# Patient Record
Sex: Female | Born: 1985 | Race: White | Hispanic: No | Marital: Married | Smoking: Never smoker
Health system: Southern US, Community
[De-identification: ages and names within clinical notes are randomized; demographics above are authoritative.]

## PROBLEM LIST (undated history)

## (undated) DIAGNOSIS — R519 Headache, unspecified: Secondary | ICD-10-CM

## (undated) DIAGNOSIS — I498 Other specified cardiac arrhythmias: Secondary | ICD-10-CM

## (undated) DIAGNOSIS — F329 Major depressive disorder, single episode, unspecified: Secondary | ICD-10-CM

## (undated) DIAGNOSIS — Z9889 Other specified postprocedural states: Secondary | ICD-10-CM

## (undated) DIAGNOSIS — F32A Depression, unspecified: Secondary | ICD-10-CM

## (undated) DIAGNOSIS — R51 Headache: Secondary | ICD-10-CM

## (undated) DIAGNOSIS — O09299 Supervision of pregnancy with other poor reproductive or obstetric history, unspecified trimester: Secondary | ICD-10-CM

## (undated) DIAGNOSIS — R112 Nausea with vomiting, unspecified: Secondary | ICD-10-CM

---

## 2010-05-09 ENCOUNTER — Ambulatory Visit: Payer: Self-pay | Admitting: Family Medicine

## 2013-06-25 ENCOUNTER — Ambulatory Visit: Payer: Self-pay | Admitting: Physician Assistant

## 2013-06-25 LAB — URINALYSIS, COMPLETE
Bilirubin,UR: NEGATIVE
GLUCOSE, UR: NEGATIVE mg/dL (ref 0–75)
KETONE: NEGATIVE
Nitrite: POSITIVE
Ph: 6 (ref 4.5–8.0)
SPECIFIC GRAVITY: 1.01 (ref 1.003–1.030)

## 2013-06-25 LAB — PREGNANCY, URINE: PREGNANCY TEST, URINE: NEGATIVE m[IU]/mL

## 2013-06-27 LAB — URINE CULTURE

## 2014-09-25 ENCOUNTER — Ambulatory Visit
Admission: EM | Admit: 2014-09-25 | Discharge: 2014-09-25 | Disposition: A | Discharge status: Home / Self Care | Payer: Self-pay | Attending: Family Medicine | Admitting: Family Medicine

## 2014-09-25 ENCOUNTER — Encounter: Payer: Self-pay | Admitting: Emergency Medicine

## 2014-09-25 DIAGNOSIS — J209 Acute bronchitis, unspecified: Secondary | ICD-10-CM

## 2014-09-25 MED ORDER — HYDROCOD POLST-CPM POLST ER 10-8 MG/5ML PO SUER: 5.0000 mL | Freq: Every evening | ORAL | Status: DC | PRN

## 2014-09-25 MED ORDER — AZITHROMYCIN 250 MG PO TABS: 250.0000 mg | ORAL_TABLET | Freq: Every day | ORAL | Status: DC

## 2014-09-25 NOTE — Discharge Instructions (Signed)

## 2014-09-25 NOTE — ED Provider Notes (Signed)
CSN: [id number]     Arrival date & time 09/25/14  1559 History   First MD Initiated Contact with Patient 09/25/14 1703     Chief Complaint  Patient presents with  . Cough   (Consider location/radiation/quality/duration/timing/severity/associated sxs/prior Treatment) HPI   This patient presents with a one-week history of congestion and cough has been productive of yellow sputum and now is causing her some chest discomfort in the center of her chest. She states that she is coughing all night long and is really getting tired of it. NyQuil does not seem to be helping. She's had some nasal congestion in the beginning but seems now to have settled in her chest. She's not been febrile nor has she had any chills.  History reviewed. No pertinent past medical history. History reviewed. No pertinent past surgical history. History reviewed. No pertinent family history. History  Substance Use Topics  . Smoking status: Never Smoker   . Smokeless tobacco: Never Used  . Alcohol Use: Yes   OB History    No data available     Review of Systems  HENT: Positive for congestion, postnasal drip, rhinorrhea and voice change.   Respiratory: Positive for cough and chest tightness.   Cardiovascular: Positive for chest pain.  Gastrointestinal: Negative for nausea, vomiting, abdominal pain and diarrhea.  All other systems reviewed and are negative.   Allergies  Review of patient's allergies indicates no known allergies.  Home Medications   Prior to Admission medications   Medication Sig Start Date End Date Taking? Authorizing Provider  azithromycin (ZITHROMAX Z-PAK) 250 MG tablet Take 1 tablet (250 mg total) by mouth daily. 09/25/14   Lutricia FeilWilliam P Roemer, PA-C  chlorpheniramine-HYDROcodone (TUSSIONEX PENNKINETIC ER) 10-8 MG/5ML SUER Take 5 mLs by mouth at bedtime as needed for cough. 09/25/14   Chrissie NoaWilliam P Roemer, PA-C   BP 107/51 mmHg  Pulse 81  Temp(Src) 98 F (36.7 C) (Oral)  Resp 16  Ht 5\' 2"  (1.575 m)   Wt 124 lb (56.246 kg)  BMI 22.67 kg/m2  SpO2 100%  LMP 09/18/2014 (Exact Date) Physical Exam  Constitutional: She is oriented to person, place, and time. She appears well-developed and well-nourished.  HENT:  Head: Normocephalic and atraumatic.  Right Ear: External ear normal.  Nose: Nose normal.  Mouth/Throat: Oropharynx is clear and moist.  Eyes: EOM are normal. Pupils are equal, round, and reactive to light. Right eye exhibits no discharge. Left eye exhibits no discharge.  Neck: Normal range of motion. Neck supple.  Cardiovascular: Normal rate, regular rhythm and normal heart sounds.  Exam reveals no gallop and no friction rub.   No murmur heard. Pulmonary/Chest: Effort normal and breath sounds normal. No respiratory distress. She has no wheezes. She has no rales. She exhibits no tenderness.  Musculoskeletal: Normal range of motion.  Lymphadenopathy:    She has no cervical adenopathy.  Neurological: She is alert and oriented to person, place, and time. She has normal reflexes.  Skin: Skin is warm and dry. No rash noted.  Psychiatric: She has a normal mood and affect. Her behavior is normal. Judgment and thought content normal.  Nursing note and vitals reviewed.   ED Course  Procedures (including critical care time) Labs Review Labs Reviewed - No data to display  Imaging Review No results found.   MDM   1. Acute bronchitis, unspecified organism    New Prescriptions   AZITHROMYCIN (ZITHROMAX Z-PAK) 250 MG TABLET    Take 1 tablet (250 mg total) by mouth  daily.   CHLORPHENIRAMINE-HYDROCODONE (TUSSIONEX PENNKINETIC ER) 10-8 MG/5ML SUER    Take 5 mLs by mouth at bedtime as needed for cough.  Plan: 1. Test/x-ray results and diagnosis reviewed with patient 2. rx as per orders; risks, benefits, potential side effects reviewed with patient 3. Recommend supportive treatment with Flonase,z pack and tussionex prn. Increase fluids and rest. 4. F/u prn if symptoms worsen or don't  improve      Lutricia Feil, PA-C 09/25/14 1730

## 2014-09-25 NOTE — ED Notes (Signed)
Patient c/o cough and chest congestion since Friday.  Patient denies fevers.  

## 2016-08-16 ENCOUNTER — Ambulatory Visit: Payer: Self-pay | Admitting: Obstetrics and Gynecology

## 2017-09-24 DIAGNOSIS — O09299 Supervision of pregnancy with other poor reproductive or obstetric history, unspecified trimester: Secondary | ICD-10-CM

## 2017-09-24 HISTORY — PX: DILATION AND CURETTAGE OF UTERUS: SHX78

## 2017-09-24 HISTORY — DX: Supervision of pregnancy with other poor reproductive or obstetric history, unspecified trimester: O09.299

## 2018-07-27 ENCOUNTER — Ambulatory Visit (INDEPENDENT_AMBULATORY_CARE_PROVIDER_SITE_OTHER)
Admit: 2018-07-27 | Discharge: 2018-07-27 | Disposition: A | Discharge status: Home / Self Care | Payer: Self-pay | Attending: Urgent Care | Admitting: Urgent Care

## 2018-07-27 ENCOUNTER — Observation Stay
Admission: EM | Admit: 2018-07-27 | Discharge: 2018-07-28 | Disposition: A | Discharge status: Home / Self Care | Payer: Self-pay | Attending: Surgery | Admitting: Surgery

## 2018-07-27 ENCOUNTER — Other Ambulatory Visit: Payer: Self-pay

## 2018-07-27 ENCOUNTER — Emergency Department: Payer: Self-pay | Admitting: Anesthesiology

## 2018-07-27 ENCOUNTER — Encounter
Admission: EM | Disposition: A | Discharge status: Home / Self Care | Payer: Self-pay | Source: Home / Self Care | Attending: Emergency Medicine

## 2018-07-27 ENCOUNTER — Encounter: Payer: Self-pay | Admitting: Emergency Medicine

## 2018-07-27 ENCOUNTER — Ambulatory Visit
Admission: EM | Admit: 2018-07-27 | Discharge: 2018-07-27 | Disposition: A | Discharge status: Other Acute Inpatient Hospital | Payer: Self-pay | Attending: Urgent Care | Admitting: Urgent Care

## 2018-07-27 DIAGNOSIS — R945 Abnormal results of liver function studies: Secondary | ICD-10-CM

## 2018-07-27 DIAGNOSIS — K625 Hemorrhage of anus and rectum: Secondary | ICD-10-CM | POA: Insufficient documentation

## 2018-07-27 DIAGNOSIS — R52 Pain, unspecified: Secondary | ICD-10-CM

## 2018-07-27 DIAGNOSIS — Z91040 Latex allergy status: Secondary | ICD-10-CM | POA: Insufficient documentation

## 2018-07-27 DIAGNOSIS — K810 Acute cholecystitis: Secondary | ICD-10-CM

## 2018-07-27 DIAGNOSIS — R1011 Right upper quadrant pain: Secondary | ICD-10-CM

## 2018-07-27 DIAGNOSIS — K819 Cholecystitis, unspecified: Secondary | ICD-10-CM

## 2018-07-27 DIAGNOSIS — R7989 Other specified abnormal findings of blood chemistry: Secondary | ICD-10-CM

## 2018-07-27 DIAGNOSIS — K8012 Calculus of gallbladder with acute and chronic cholecystitis without obstruction: Principal | ICD-10-CM | POA: Insufficient documentation

## 2018-07-27 HISTORY — DX: Headache, unspecified: R51.9

## 2018-07-27 HISTORY — DX: Nausea with vomiting, unspecified: R11.2

## 2018-07-27 HISTORY — DX: Other specified cardiac arrhythmias: I49.8

## 2018-07-27 HISTORY — DX: Headache: R51

## 2018-07-27 HISTORY — DX: Depression, unspecified: F32.A

## 2018-07-27 HISTORY — DX: Major depressive disorder, single episode, unspecified: F32.9

## 2018-07-27 HISTORY — DX: Supervision of pregnancy with other poor reproductive or obstetric history, unspecified trimester: O09.299

## 2018-07-27 HISTORY — PX: CHOLECYSTECTOMY: SHX55

## 2018-07-27 HISTORY — DX: Nausea with vomiting, unspecified: Z98.890

## 2018-07-27 LAB — CBC WITH DIFFERENTIAL/PLATELET
Abs Immature Granulocytes: 0.03 10*3/uL (ref 0.00–0.07)
Basophils Absolute: 0 10*3/uL (ref 0.0–0.1)
Basophils Relative: 0 %
Eosinophils Absolute: 0.2 10*3/uL (ref 0.0–0.5)
Eosinophils Relative: 2 %
HCT: 34.3 % — ABNORMAL LOW (ref 36.0–46.0)
Hemoglobin: 11.6 g/dL — ABNORMAL LOW (ref 12.0–15.0)
Immature Granulocytes: 0 %
Lymphocytes Relative: 19 %
Lymphs Abs: 1.8 10*3/uL (ref 0.7–4.0)
MCH: 30.4 pg (ref 26.0–34.0)
MCHC: 33.8 g/dL (ref 30.0–36.0)
MCV: 89.8 fL (ref 80.0–100.0)
Monocytes Absolute: 0.8 10*3/uL (ref 0.1–1.0)
Monocytes Relative: 9 %
Neutro Abs: 6.6 10*3/uL (ref 1.7–7.7)
Neutrophils Relative %: 70 %
Platelets: 278 10*3/uL (ref 150–400)
RBC: 3.82 MIL/uL — ABNORMAL LOW (ref 3.87–5.11)
RDW: 14.1 % (ref 11.5–15.5)
WBC: 9.6 10*3/uL (ref 4.0–10.5)
nRBC: 0 % (ref 0.0–0.2)

## 2018-07-27 LAB — COMPREHENSIVE METABOLIC PANEL
ALT: 436 U/L — ABNORMAL HIGH (ref 0–44)
AST: 159 U/L — ABNORMAL HIGH (ref 15–41)
Albumin: 3.9 g/dL (ref 3.5–5.0)
Alkaline Phosphatase: 125 U/L (ref 38–126)
Anion gap: 6 (ref 5–15)
BUN: 11 mg/dL (ref 6–20)
CO2: 24 mmol/L (ref 22–32)
Calcium: 8.6 mg/dL — ABNORMAL LOW (ref 8.9–10.3)
Chloride: 106 mmol/L (ref 98–111)
Creatinine, Ser: 0.73 mg/dL (ref 0.44–1.00)
GFR calc Af Amer: >60 mL/min (ref >60)
GFR calc non Af Amer: >60 mL/min (ref >60)
Glucose, Bld: 105 mg/dL — ABNORMAL HIGH (ref 70–99)
Potassium: 3.9 mmol/L (ref 3.5–5.1)
Sodium: 136 mmol/L (ref 135–145)
Total Bilirubin: 1.4 mg/dL — ABNORMAL HIGH (ref 0.3–1.2)
Total Protein: 7 g/dL (ref 6.5–8.1)

## 2018-07-27 LAB — LIPASE, BLOOD: Lipase: 30 U/L (ref 11–51)

## 2018-07-27 LAB — POCT PREGNANCY, URINE: Preg Test, Ur: NEGATIVE

## 2018-07-27 SURGERY — LAPAROSCOPIC CHOLECYSTECTOMY
Anesthesia: General | Site: Abdomen

## 2018-07-27 MED ORDER — ALUM & MAG HYDROXIDE-SIMETH 200-200-20 MG/5ML PO SUSP
30.0000 mL | Freq: Once | ORAL | Status: AC
  Administered 2018-07-27: 09:00:00 30 mL via ORAL

## 2018-07-27 MED ORDER — PROMETHAZINE HCL 25 MG/ML IJ SOLN
6.2500 mg | INTRAMUSCULAR | Status: DC | PRN
  Administered 2018-07-27: 12.5 mg via INTRAVENOUS

## 2018-07-27 MED ORDER — ONDANSETRON HCL 4 MG/2ML IJ SOLN: 4.0000 mg | Freq: Four times a day (QID) | INTRAMUSCULAR | Status: DC | PRN

## 2018-07-27 MED ORDER — DEXAMETHASONE SODIUM PHOSPHATE 10 MG/ML IJ SOLN
INTRAMUSCULAR | Status: DC | PRN
  Administered 2018-07-27: 4 mg via INTRAVENOUS

## 2018-07-27 MED ORDER — KCL IN DEXTROSE-NACL 20-5-0.45 MEQ/L-%-% IV SOLN
INTRAVENOUS | Status: DC
  Administered 2018-07-27 – 2018-07-28 (×2): via INTRAVENOUS
  Filled 2018-07-27 (×5): qty 1000

## 2018-07-27 MED ORDER — SUGAMMADEX SODIUM 200 MG/2ML IV SOLN
INTRAVENOUS | Status: AC
  Filled 2018-07-27: qty 2

## 2018-07-27 MED ORDER — LACTATED RINGERS IV SOLN
INTRAVENOUS | Status: DC
  Administered 2018-07-27: 100 mL/h via INTRAVENOUS

## 2018-07-27 MED ORDER — SODIUM CHLORIDE 0.9 % IV BOLUS
1000.0000 mL | Freq: Once | INTRAVENOUS | Status: AC
  Administered 2018-07-27: 1000 mL via INTRAVENOUS

## 2018-07-27 MED ORDER — GLYCOPYRROLATE 0.2 MG/ML IJ SOLN
INTRAMUSCULAR | Status: DC | PRN
  Administered 2018-07-27: 0.2 mg via INTRAVENOUS

## 2018-07-27 MED ORDER — FENTANYL CITRATE (PF) 100 MCG/2ML IJ SOLN
50.0000 ug | Freq: Once | INTRAMUSCULAR | Status: AC
  Administered 2018-07-27: 50 ug via INTRAVENOUS
  Filled 2018-07-27: qty 2

## 2018-07-27 MED ORDER — FENTANYL CITRATE (PF) 100 MCG/2ML IJ SOLN
INTRAMUSCULAR | Status: AC
  Filled 2018-07-27: qty 2

## 2018-07-27 MED ORDER — ONDANSETRON HCL 4 MG/2ML IJ SOLN
4.0000 mg | Freq: Once | INTRAMUSCULAR | Status: AC
  Administered 2018-07-27: 4 mg via INTRAVENOUS
  Filled 2018-07-27: qty 2

## 2018-07-27 MED ORDER — SODIUM CHLORIDE FLUSH 0.9 % IV SOLN
INTRAVENOUS | Status: AC
  Filled 2018-07-27: qty 10

## 2018-07-27 MED ORDER — GLYCOPYRROLATE 0.2 MG/ML IJ SOLN
INTRAMUSCULAR | Status: AC
  Filled 2018-07-27: qty 1

## 2018-07-27 MED ORDER — FENTANYL CITRATE (PF) 100 MCG/2ML IJ SOLN
25.0000 ug | INTRAMUSCULAR | Status: DC | PRN
  Administered 2018-07-27 (×2): 50 ug via INTRAVENOUS

## 2018-07-27 MED ORDER — LIDOCAINE HCL (CARDIAC) PF 100 MG/5ML IV SOSY
PREFILLED_SYRINGE | INTRAVENOUS | Status: DC | PRN
  Administered 2018-07-27: 50 mg via INTRAVENOUS

## 2018-07-27 MED ORDER — CHLORHEXIDINE GLUCONATE CLOTH 2 % EX PADS: 6.0000 | MEDICATED_PAD | Freq: Once | CUTANEOUS | Status: DC

## 2018-07-27 MED ORDER — ONDANSETRON HCL 4 MG/2ML IJ SOLN
INTRAMUSCULAR | Status: AC
  Administered 2018-07-27: 8 mg
  Filled 2018-07-27: qty 4

## 2018-07-27 MED ORDER — SUCCINYLCHOLINE CHLORIDE 20 MG/ML IJ SOLN
INTRAMUSCULAR | Status: DC | PRN
  Administered 2018-07-27: 100 mg via INTRAVENOUS

## 2018-07-27 MED ORDER — FENTANYL CITRATE (PF) 100 MCG/2ML IJ SOLN
INTRAMUSCULAR | Status: DC | PRN
  Administered 2018-07-27: 100 ug via INTRAVENOUS
  Administered 2018-07-27 (×2): 50 ug via INTRAVENOUS

## 2018-07-27 MED ORDER — ACETAMINOPHEN 500 MG PO TABS
1000.0000 mg | ORAL_TABLET | ORAL | Status: AC
  Administered 2018-07-27: 14:00:00 1000 mg via ORAL

## 2018-07-27 MED ORDER — LIDOCAINE HCL (PF) 2 % IJ SOLN
INTRAMUSCULAR | Status: AC
  Filled 2018-07-27: qty 10

## 2018-07-27 MED ORDER — LIDOCAINE VISCOUS HCL 2 % MT SOLN
15.0000 mL | Freq: Once | OROMUCOSAL | Status: AC
  Administered 2018-07-27: 09:00:00 15 mL via ORAL

## 2018-07-27 MED ORDER — DEXAMETHASONE SODIUM PHOSPHATE 4 MG/ML IJ SOLN
INTRAMUSCULAR | Status: AC
  Filled 2018-07-27: qty 1

## 2018-07-27 MED ORDER — ONDANSETRON HCL 4 MG/2ML IJ SOLN
INTRAMUSCULAR | Status: DC | PRN
  Administered 2018-07-27: 4 mg via INTRAVENOUS

## 2018-07-27 MED ORDER — PROPOFOL 10 MG/ML IV BOLUS
INTRAVENOUS | Status: DC | PRN
  Administered 2018-07-27: 150 mg via INTRAVENOUS

## 2018-07-27 MED ORDER — MORPHINE SULFATE (PF) 2 MG/ML IV SOLN
2.0000 mg | INTRAVENOUS | Status: DC | PRN
  Administered 2018-07-27: 2 mg via INTRAVENOUS
  Filled 2018-07-27: qty 1

## 2018-07-27 MED ORDER — PROPOFOL 10 MG/ML IV BOLUS
INTRAVENOUS | Status: AC
  Filled 2018-07-27: qty 20

## 2018-07-27 MED ORDER — CHLORHEXIDINE GLUCONATE CLOTH 2 % EX PADS
6.0000 | MEDICATED_PAD | Freq: Once | CUTANEOUS | Status: AC
  Administered 2018-07-27: 6 via TOPICAL

## 2018-07-27 MED ORDER — ACETAMINOPHEN 500 MG PO TABS
1000.0000 mg | ORAL_TABLET | Freq: Four times a day (QID) | ORAL | Status: DC
  Administered 2018-07-27 – 2018-07-28 (×2): 1000 mg via ORAL
  Filled 2018-07-27 (×2): qty 2

## 2018-07-27 MED ORDER — PROMETHAZINE HCL 25 MG/ML IJ SOLN
INTRAMUSCULAR | Status: AC
  Filled 2018-07-27: qty 1

## 2018-07-27 MED ORDER — PIPERACILLIN-TAZOBACTAM 3.375 G IVPB
INTRAVENOUS | Status: AC
  Filled 2018-07-27: qty 50

## 2018-07-27 MED ORDER — MIDAZOLAM HCL 2 MG/2ML IJ SOLN
INTRAMUSCULAR | Status: DC | PRN
  Administered 2018-07-27: 2 mg via INTRAVENOUS

## 2018-07-27 MED ORDER — ROCURONIUM BROMIDE 50 MG/5ML IV SOLN
INTRAVENOUS | Status: AC
  Filled 2018-07-27: qty 1

## 2018-07-27 MED ORDER — SODIUM CHLORIDE FLUSH 0.9 % IV SOLN
INTRAVENOUS | Status: AC
  Administered 2018-07-27: 14:00:00
  Filled 2018-07-27: qty 10

## 2018-07-27 MED ORDER — ACETAMINOPHEN 500 MG PO TABS
ORAL_TABLET | ORAL | Status: AC
  Administered 2018-07-27: 1000 mg via ORAL
  Filled 2018-07-27: qty 2

## 2018-07-27 MED ORDER — OXYCODONE HCL 5 MG PO TABS
5.0000 mg | ORAL_TABLET | ORAL | Status: DC | PRN
  Administered 2018-07-27 – 2018-07-28 (×3): 5 mg via ORAL
  Filled 2018-07-27 (×3): qty 1

## 2018-07-27 MED ORDER — PIPERACILLIN-TAZOBACTAM 3.375 G IVPB 30 MIN
3.3750 g | Freq: Once | INTRAVENOUS | Status: AC
  Administered 2018-07-27: 3.375 g via INTRAVENOUS
  Filled 2018-07-27: qty 50

## 2018-07-27 MED ORDER — SODIUM CHLORIDE 0.9 % IV SOLN
8.0000 mg | Freq: Once | INTRAVENOUS | Status: DC
  Filled 2018-07-27: qty 4

## 2018-07-27 MED ORDER — ROCURONIUM BROMIDE 100 MG/10ML IV SOLN
INTRAVENOUS | Status: DC | PRN
  Administered 2018-07-27: 5 mg via INTRAVENOUS
  Administered 2018-07-27: 35 mg via INTRAVENOUS

## 2018-07-27 MED ORDER — SODIUM CHLORIDE 0.9 % IV SOLN
2.0000 g | INTRAVENOUS | Status: DC
  Administered 2018-07-27: 2 g via INTRAVENOUS
  Filled 2018-07-27 (×2): qty 20

## 2018-07-27 MED ORDER — SUGAMMADEX SODIUM 200 MG/2ML IV SOLN
INTRAVENOUS | Status: DC | PRN
  Administered 2018-07-27: 150 mg via INTRAVENOUS

## 2018-07-27 MED ORDER — LIDOCAINE HCL 1 % IJ SOLN
INTRAMUSCULAR | Status: DC | PRN
  Administered 2018-07-27: 20 mL via INTRAMUSCULAR

## 2018-07-27 MED ORDER — ONDANSETRON HCL 4 MG/2ML IJ SOLN
INTRAMUSCULAR | Status: AC
  Filled 2018-07-27: qty 2

## 2018-07-27 MED ORDER — ENOXAPARIN SODIUM 40 MG/0.4ML ~~LOC~~ SOLN: 40.0000 mg | SUBCUTANEOUS | Status: DC

## 2018-07-27 MED ORDER — MIDAZOLAM HCL 2 MG/2ML IJ SOLN
INTRAMUSCULAR | Status: AC
  Filled 2018-07-27: qty 2

## 2018-07-27 MED ORDER — ONDANSETRON 4 MG PO TBDP: 4.0000 mg | ORAL_TABLET | Freq: Four times a day (QID) | ORAL | Status: DC | PRN

## 2018-07-27 SURGICAL SUPPLY — 36 items
APPLIER CLIP ROT 10 11.4 M/L (STAPLE) ×3
CHLORAPREP W/TINT 26 (MISCELLANEOUS) ×3 IMPLANT
CLIP APPLIE ROT 10 11.4 M/L (STAPLE) ×1 IMPLANT
COVER WAND RF STERILE (DRAPES) ×3 IMPLANT
DECANTER SPIKE VIAL GLASS SM (MISCELLANEOUS) ×6 IMPLANT
DERMABOND ADVANCED (GAUZE/BANDAGES/DRESSINGS) ×2
DERMABOND ADVANCED .7 DNX12 (GAUZE/BANDAGES/DRESSINGS) ×1 IMPLANT
DRESSING SURGICEL FIBRLLR 1X2 (HEMOSTASIS) IMPLANT
DRSG SURGICEL FIBRILLAR 1X2 (HEMOSTASIS)
ELECT REM PT RETURN 9FT ADLT (ELECTROSURGICAL) ×3
ELECTRODE REM PT RTRN 9FT ADLT (ELECTROSURGICAL) ×1 IMPLANT
GLOVE BIO SURGEON STRL SZ7 (GLOVE) ×3 IMPLANT
GLOVE BIOGEL PI IND STRL 7.5 (GLOVE) ×1 IMPLANT
GLOVE BIOGEL PI INDICATOR 7.5 (GLOVE) ×2
GOWN STRL REUS W/ TWL LRG LVL3 (GOWN DISPOSABLE) ×3 IMPLANT
GOWN STRL REUS W/TWL LRG LVL3 (GOWN DISPOSABLE) ×6
GRASPER SUT TROCAR 14GX15 (MISCELLANEOUS) ×3 IMPLANT
IRRIGATION STRYKERFLOW (MISCELLANEOUS) IMPLANT
IRRIGATOR STRYKERFLOW (MISCELLANEOUS)
IV NS 1000ML (IV SOLUTION)
IV NS 1000ML BAXH (IV SOLUTION) IMPLANT
KIT TURNOVER KIT A (KITS) ×3 IMPLANT
LABEL OR SOLS (LABEL) ×3 IMPLANT
NEEDLE HYPO 22GX1.5 SAFETY (NEEDLE) ×3 IMPLANT
NEEDLE INSUFFLATION 14GA 120MM (NEEDLE) ×3 IMPLANT
NS IRRIG 1000ML POUR BTL (IV SOLUTION) ×3 IMPLANT
PACK LAP CHOLECYSTECTOMY (MISCELLANEOUS) ×3 IMPLANT
POUCH SPECIMEN RETRIEVAL 10MM (ENDOMECHANICALS) ×3 IMPLANT
SCISSORS METZENBAUM CVD 33 (INSTRUMENTS) ×3 IMPLANT
SET TUBE SMOKE EVAC HIGH FLOW (TUBING) ×3 IMPLANT
SLEEVE ENDOPATH XCEL 5M (ENDOMECHANICALS) ×6 IMPLANT
SUT MNCRL AB 4-0 PS2 18 (SUTURE) ×3 IMPLANT
SUT VICRYL 0 UR6 27IN ABS (SUTURE) ×3 IMPLANT
SUT VICRYL AB 3-0 FS1 BRD 27IN (SUTURE) ×3 IMPLANT
TROCAR XCEL 12X100 BLDLESS (ENDOMECHANICALS) ×3 IMPLANT
TROCAR XCEL NON-BLD 5MMX100MML (ENDOMECHANICALS) ×3 IMPLANT

## 2018-07-27 NOTE — H&P (Signed)
SURGICAL ADMISSION HISTORY & PHYSICAL  HISTORY OF PRESENT ILLNESS (HPI):  33 y.o. female presented to [facility] ED today for evaluation of abdominal pain. Patient reports she first developed epigastric > RUQ abdominal pain 2 days ago (specifically, Tuesday evening 3/31) after eating chicken noodle soup made from scratch. Patient says she has not eaten chicken/meat x 15 years until the past month after she experienced loss of fetal twins and decided to disregard her prior diet. Instead of improving, her pain has instead worsened, becoming severe over the RUQ. She otherwise is not aware of prior RUQ abdominal pain and denies routine/frequent GERD. She denies any fever/chills, cough, CP, or SOB, N/V, or diarrhea. Of note, patient also mentions blood with BM's exclusively during menstruation that ceased during her recent pregnancy and resumed following her miscarriage. She was scheduled to undergo colonoscopy prior to her recent pregnancy, but that was cancelled when she became pregnant and has not since been rescheduled. She also mentions D&C was not performed until 4 days after fetal demise, resulting in sepsis, which resolved following D&C.  Surgery is consulted by ED physician Dr. Don Perking in this context for evaluation and management of acute cholecystitis.  PAST MEDICAL HISTORY (PMH):  History reviewed. No pertinent past medical history.   PAST SURGICAL HISTORY (PSH):       Past Surgical History:  Procedure Laterality Date  . DILATION AND CURETTAGE OF UTERUS      MEDICATIONS:  Prior to Admission medications   Not on File    ALLERGIES:       Allergies  Allergen Reactions  . Latex Other (See Comments)    Burning, redness, edema.    SOCIAL HISTORY: +marijuana (occasional) Social History        Socioeconomic History  . Marital status: Married    Spouse name: Not on file  . Number of children: Not on file  . Years of education: Not on file  . Highest education level: Not on  file  Occupational History  . Not on file  Social Needs  . Financial resource strain: Not on file  . Food insecurity:    Worry: Not on file    Inability: Not on file  . Transportation needs:    Medical: Not on file    Non-medical: Not on file  Tobacco Use  . Smoking status: Never Smoker  . Smokeless tobacco: Never Used  Substance and Sexual Activity  . Alcohol use: Yes    Comment: occasionally  . Drug use: No  . Sexual activity: Not on file  Lifestyle  . Physical activity:    Days per week: Not on file    Minutes per session: Not on file  . Stress: Not on file  Relationships  . Social connections:    Talks on phone: Not on file    Gets together: Not on file    Attends religious service: Not on file    Active member of club or organization: Not on file    Attends meetings of clubs or organizations: Not on file    Relationship status: Not on file  . Intimate partner violence:    Fear of current or ex partner: Not on file    Emotionally abused: Not on file    Physically abused: Not on file    Forced sexual activity: Not on file  Other Topics Concern  . Not on file  Social History Narrative  . Not on file    The patient currently resides (home / rehab  facility / nursing home): Home The patient normally is (ambulatory / bedbound): Ambulatory   FAMILY HISTORY:  No family history on file.   REVIEW OF SYSTEMS:  Constitutional: denies weight loss, fever, chills, or sweats  Eyes: denies any other vision changes, history of eye injury  ENT: denies sore throat, hearing problems  Respiratory: denies shortness of breath, wheezing  Cardiovascular: denies chest pain, palpitations  Gastrointestinal: abdominal pain, N/V, and bowel function as per HPI Genitourinary: denies burning with urination or urinary frequency Musculoskeletal: denies any other joint pains or cramps  Skin: denies any other rashes or skin discolorations  Neurological:  denies any other headache, dizziness, weakness  Psychiatric: denies any other depression, anxiety   All other review of systems were negative   VITAL SIGNS:  Temp:  [98.5 F (36.9 C)] 98.5 F (36.9 C) (04/02 0834) Pulse Rate:  [40-61] 61 (04/02 1230) Resp:  [16-20] 20 (04/02 1230) BP: (126-132)/(66-91) 132/86 (04/02 1230) SpO2:  [99 %-100 %] 99 % (04/02 1230) Weight:  [59 kg] 59 kg (04/02 1130)     Height: 5\' 2"  (157.5 cm) Weight: 59 kg BMI (Calculated): 23.77   INTAKE/OUTPUT:  This shift: No intake/output data recorded.  Last 2 shifts: @IOLAST2SHIFTS @   PHYSICAL EXAM:  Constitutional:  -- Normal body habitus  -- Awake, alert, and oriented x3, no apparent distress Eyes:  -- Pupils equally round and reactive to light  -- No scleral icterus, B/L no occular discharge Ear, nose, throat: -- Neck is FROM WNL -- No jugular venous distension  Pulmonary:  -- No wheezes or rhales -- Equal breath sounds bilaterally -- Breathing non-labored at rest Cardiovascular:  -- S1, S2 present  -- No pericardial rubs  Gastrointestinal:  -- Abdomen soft and non-distended with severe epigastric and RUQ abdominal tenderness to palpation, particularly RUQ with deep inspiration (+Murphy's sign), no guarding or rebound tenderness -- No abdominal masses appreciated, pulsatile or otherwise  Musculoskeletal and Integumentary:  -- Wounds or skin discoloration: None appreciated -- Extremities: B/L UE and LE FROM, hands and feet warm, no edema  Neurologic:  -- Motor function: Intact and symmetric -- Sensation: Intact and symmetric Psychiatric:  -- Mood and affect WNL  Labs:  CBC Latest Ref Rng & Units 07/27/2018  WBC 4.0 - 10.5 K/uL 9.6  Hemoglobin 12.0 - 15.0 g/dL 11.6(L)  Hematocrit 36.0 - 46.0 % 34.3(L)  Platelets 150 - 400 K/uL 278   CMP Latest Ref Rng & Units 07/27/2018  Glucose 70 - 99 mg/dL 580(D)  BUN 6 - 20 mg/dL 11  Creatinine 9.83 - 3.82 mg/dL 5.05  Sodium 397 - 673 mmol/L 136   Potassium 3.5 - 5.1 mmol/L 3.9  Chloride 98 - 111 mmol/L 106  CO2 22 - 32 mmol/L 24  Calcium 8.9 - 10.3 mg/dL 4.1(P)  Total Protein 6.5 - 8.1 g/dL 7.0  Total Bilirubin 0.3 - 1.2 mg/dL 3.7(T)  Alkaline Phos 38 - 126 U/L 125  AST 15 - 41 U/L 159(H)  ALT 0 - 44 U/L 436(H)   Total Bilirubin previously WNL at [facility] (09/2017)  Imaging studies:  Limited RUQ Abdominal Ultrasound (07/27/2018) - available images personally reviewed and discussed with patient and with interpreting radiologist, Dr. Kearney Hard, who reports gallbladder wall thickening ~8 mm and trace pericholecystic fluid, but non-visualization of biliary sludge, though suggests possible non-visualized cystic duct gallstone Gallbladder wall is thickened measuring up to 6 mm. No visible stones. The patient was tender over the gallbladder during the study. Common bile duct diameter:  Normal caliber, 3 mm. Trace perihepatic ascites.  Assessment/Plan: (ICD-10's: K80.01) 33 y.o. female with acute cholecystitis of unclear etiology, whether acalculous (unusual for young healthy patient) or non-visualized cholelithiasis/biliary sludge, complicated by pertinent comorbidities including semi-recent fetal loss and blood per rectum exclusively with menstruation.              - NPO, IV fluid              - agree with pre-op antibiotics (Zosyn administered by ED)             - discussed consideration for confirmative HIDA or MRCP, but patient expresses preference to proceed with cholecystectomy             - all risks, benefits, and alternatives to cholecystectomy +/- IOC were discussed with the patient, all of her questions were answered to her expressed satisfaction, patient expresses she wishes to proceed, and informed consent was obtained.              - upon discussion with and approval from Dr. Everlene Farrier, will proceed with laparoscopic cholecystectomy +/- IOC             - outpatient primary care and GI follow-up for menstrual rectal bleeding              - will repeat LFT's tomorrow morning             - DVT prophylaxis  All of the above findings and recommendations were discussed with the patient, and all of patient's questions were answered to her expressed satisfaction.  -- Scherrie Gerlach Earlene Plater, MD, RPVI [place]: [facility] General Surgery - Partnering for exceptional care. Office: [phone]

## 2018-07-27 NOTE — Anesthesia Procedure Notes (Signed)
Procedure Name: Intubation Performed by: Terina Mcelhinny, CRNA Pre-anesthesia Checklist: Patient identified, Patient being monitored, Timeout performed, Emergency Drugs available and Suction available Patient Re-evaluated:Patient Re-evaluated prior to induction Oxygen Delivery Method: Circle system utilized Preoxygenation: Pre-oxygenation with 100% oxygen Induction Type: IV induction Ventilation: Mask ventilation without difficulty Laryngoscope Size: 3 and McGraph Grade View: Grade I Tube type: Oral Tube size: 7.0 mm Number of attempts: 1 Airway Equipment and Method: Stylet and Video-laryngoscopy Placement Confirmation: ETT inserted through vocal cords under direct vision,  positive ETCO2 and breath sounds checked- equal and bilateral Secured at: 21 cm Tube secured with: Tape Dental Injury: Teeth and Oropharynx as per pre-operative assessment        

## 2018-07-27 NOTE — Discharge Instructions (Addendum)
Your liver function tests are elevated. Your ultrasound suggests that you have an inflamed gallbladder.   I have spoken with Dr. Satira Mccallum, who is an excellent surgeon. He would like to see you NOW in the ER. His plans are for you to have surgery today.

## 2018-07-27 NOTE — ED Notes (Signed)
Report given to OR.

## 2018-07-27 NOTE — Consult Note (Signed)
SURGICAL CONSULTATION NOTE (initial) - cpt: 99244  HISTORY OF PRESENT ILLNESS (HPI):  33 y.o. female presented to [facility] ED today for evaluation of abdominal pain. Patient reports she first developed epigastric > RUQ abdominal pain 2 days ago (specifically, Tuesday evening 3/31) after eating chicken noodle soup made from scratch. Patient says she has not eaten chicken/meat x 15 years until the past month after she experienced loss of fetal twins and decided to disregard her prior diet. Instead of improving, her pain has instead worsened, becoming severe over the RUQ. She otherwise is not aware of prior RUQ abdominal pain and denies routine/frequent GERD. She denies any fever/chills, cough, CP, or SOB, N/V, or diarrhea. Of note, patient also mentions blood with BM's exclusively during menstruation that ceased during her recent pregnancy and resumed following her miscarriage. She was scheduled to undergo colonoscopy prior to her recent pregnancy, but that was cancelled when she became pregnant and has not since been rescheduled. She also mentions D&C was not performed until 4 days after fetal demise, resulting in sepsis, which resolved following D&C.  Surgery is consulted by ED physician Dr. Don Perking in this context for evaluation and management of acute cholecystitis.  PAST MEDICAL HISTORY (PMH):  History reviewed. No pertinent past medical history.   PAST SURGICAL HISTORY (PSH):  Past Surgical History:  Procedure Laterality Date  . DILATION AND CURETTAGE OF UTERUS      MEDICATIONS:  Prior to Admission medications   Not on File    ALLERGIES:  Allergies  Allergen Reactions  . Latex Other (See Comments)    Burning, redness, edema.    SOCIAL HISTORY: +marijuana (occasional) Social History   Socioeconomic History  . Marital status: Married    Spouse name: Not on file  . Number of children: Not on file  . Years of education: Not on file  . Highest education level: Not on file   Occupational History  . Not on file  Social Needs  . Financial resource strain: Not on file  . Food insecurity:    Worry: Not on file    Inability: Not on file  . Transportation needs:    Medical: Not on file    Non-medical: Not on file  Tobacco Use  . Smoking status: Never Smoker  . Smokeless tobacco: Never Used  Substance and Sexual Activity  . Alcohol use: Yes    Comment: occasionally  . Drug use: No  . Sexual activity: Not on file  Lifestyle  . Physical activity:    Days per week: Not on file    Minutes per session: Not on file  . Stress: Not on file  Relationships  . Social connections:    Talks on phone: Not on file    Gets together: Not on file    Attends religious service: Not on file    Active member of club or organization: Not on file    Attends meetings of clubs or organizations: Not on file    Relationship status: Not on file  . Intimate partner violence:    Fear of current or ex partner: Not on file    Emotionally abused: Not on file    Physically abused: Not on file    Forced sexual activity: Not on file  Other Topics Concern  . Not on file  Social History Narrative  . Not on file    The patient currently resides (home / rehab facility / nursing home): Home The patient normally is (ambulatory / bedbound): Ambulatory  FAMILY HISTORY:  No family history on file.   REVIEW OF SYSTEMS:  Constitutional: denies weight loss, fever, chills, or sweats  Eyes: denies any other vision changes, history of eye injury  ENT: denies sore throat, hearing problems  Respiratory: denies shortness of breath, wheezing  Cardiovascular: denies chest pain, palpitations  Gastrointestinal: abdominal pain, N/V, and bowel function as per HPI Genitourinary: denies burning with urination or urinary frequency Musculoskeletal: denies any other joint pains or cramps  Skin: denies any other rashes or skin discolorations  Neurological: denies any other headache, dizziness,  weakness  Psychiatric: denies any other depression, anxiety   All other review of systems were negative   VITAL SIGNS:  Temp:  [98.5 F (36.9 C)] 98.5 F (36.9 C) (04/02 0834) Pulse Rate:  [40-61] 61 (04/02 1230) Resp:  [16-20] 20 (04/02 1230) BP: (126-132)/(66-91) 132/86 (04/02 1230) SpO2:  [99 %-100 %] 99 % (04/02 1230) Weight:  [59 kg] 59 kg (04/02 1130)     Height:  (157.5 cm) Weight: 59 kg BMI (Calculated): 23.77   INTAKE/OUTPUT:  This shift: No intake/output data recorded.  Last 2 shifts: @   PHYSICAL EXAM:  Constitutional:  -- Normal body habitus  -- Awake, alert, and oriented x3, no apparent distress Eyes:  -- Pupils equally round and reactive to light  -- No scleral icterus, B/L no occular discharge Ear, nose, throat: -- Neck is FROM WNL -- No jugular venous distension  Pulmonary:  -- No wheezes or rhales -- Equal breath sounds bilaterally -- Breathing non-labored at rest Cardiovascular:  -- S1, S2 present  -- No pericardial rubs  Gastrointestinal:  -- Abdomen soft and non-distended with severe epigastric and RUQ abdominal tenderness to palpation, particularly RUQ with deep inspiration (+Murphy's sign), no guarding or rebound tenderness -- No abdominal masses appreciated, pulsatile or otherwise  Musculoskeletal and Integumentary:  -- Wounds or skin discoloration: None appreciated -- Extremities: B/L UE and LE FROM, hands and feet warm, no edema  Neurologic:  -- Motor function: Intact and symmetric -- Sensation: Intact and symmetric Psychiatric:  -- Mood and affect WNL  Labs:  CBC Latest Ref Rng & Units 07/27/2018  WBC 4.0 - 10.5 K/uL 9.6  Hemoglobin 12.0 - 15.0 g/dL 11.6(L)  Hematocrit 36.0 - 46.0 % 34.3(L)  Platelets 150 - 400 K/uL 278   CMP Latest Ref Rng & Units 07/27/2018  Glucose 70 - 99 mg/dL 161(W)  BUN 6 - 20 mg/dL 11  Creatinine 9.60 - 4.54 mg/dL 0.98  Sodium 119 - 147 mmol/L 136  Potassium 3.5 - 5.1 mmol/L 3.9  Chloride 98  - 111 mmol/L 106  CO2 22 - 32 mmol/L 24  Calcium 8.9 - 10.3 mg/dL 8.2(N)  Total Protein 6.5 - 8.1 g/dL 7.0  Total Bilirubin 0.3 - 1.2 mg/dL 5.6(O)  Alkaline Phos 38 - 126 U/L 125  AST 15 - 41 U/L 159(H)  ALT 0 - 44 U/L 436(H)   Total Bilirubin previously WNL at [facility] (09/2017)  Imaging studies:  Limited RUQ Abdominal Ultrasound (07/27/2018) - available images personally reviewed and discussed with patient and with interpreting radiologist, Dr. Kearney Hard, who reports gallbladder wall thickening ~8 mm and trace pericholecystic fluid, but non-visualization of biliary sludge, though suggests possible non-visualized cystic duct gallstone Gallbladder wall is thickened measuring up to 6 mm. No visible stones. The patient was tender over the gallbladder during the study. Common bile duct diameter: Normal caliber, 3 mm. Trace perihepatic ascites.  Assessment/Plan: (ICD-10's: K80.01) 33 y.o. female with  acute cholecystitis of unclear etiology, whether acalculous (unusual for young healthy patient) or non-visualized cholelithiasis/biliary sludge, complicated by pertinent comorbidities including semi-recent fetal loss and blood per rectum exclusively with menstruation.   - NPO, IV fluid   - agree with pre-op antibiotics (Zosyn administered by ED)  - discussed consideration for confirmative HIDA or MRCP, but patient expresses preference to proceed with cholecystectomy  - all risks, benefits, and alternatives to cholecystectomy +/- IOC were discussed with the patient, all of her questions were answered to her expressed satisfaction, patient expresses she wishes to proceed, and informed consent was obtained.   - upon discussion with and approval from Dr. Everlene Farrier, will proceed with laparoscopic cholecystectomy +/- IOC  - outpatient primary care and GI follow-up for menstrual rectal bleeding  - will repeat LFT's tomorrow morning  - DVT prophylaxis  All of the above findings and recommendations were discussed with  the patient, and all of patient's questions were answered to her expressed satisfaction.  Thank you for the opportunity to participate in this patient's care.   -- Scherrie Gerlach Earlene Plater, MD, RPVI [place]: [facility] General Surgery - Partnering for exceptional care. Office: [phone]

## 2018-07-27 NOTE — Anesthesia Post-op Follow-up Note (Signed)
Anesthesia QCDR form completed.        

## 2018-07-27 NOTE — ED Provider Notes (Signed)
[facility] Emergency Department Provider Note  ____________________________________________  Time seen: Approximately 12:44 PM  I have reviewed the triage vital signs and the nursing notes.   HISTORY  Chief Complaint Abdominal Cramping   HPI Leslie Chang is a 33 y.o. female no significant past medical history who was sent here from [facility] urgent care for acute cholecystitis.  Patient reports 2 days of sharp constant upper abdominal pain associated with nausea and several episodes of nonbloody nonbilious emesis.  The pain started after she had chicken noodle soup 2 nights ago.  The pain is constantly moderate in intensity.  At urgent care she had labs showing elevated LFTs, T bili, and a right upper quadrant ultrasound consistent with acute cholecystitis.  Patient denies fever chills, cough, chest pain or shortness of breath.  No prior abdominal surgeries.  PMH None - reviewed  Past Surgical History:  Procedure Laterality Date  . DILATION AND CURETTAGE OF UTERUS      Prior to Admission medications   Not on File    Allergies Latex  No family history on file.  Social History Social History   Tobacco Use  . Smoking status: Never Smoker  . Smokeless tobacco: Never Used  Substance Use Topics  . Alcohol use: Yes    Comment: occasionally  . Drug use: No    Review of Systems  Constitutional: Negative for fever. Eyes: Negative for visual changes. ENT: Negative for sore throat. Neck: No neck pain  Cardiovascular: Negative for chest pain. Respiratory: Negative for shortness of breath. Gastrointestinal: + upper abdominal pain, nausea, and vomiting. No diarrhea. Genitourinary: Negative for dysuria. Musculoskeletal: Negative for back pain. Skin: Negative for rash. Neurological: Negative for headaches, weakness or numbness. Psych: No SI or HI  ____________________________________________   PHYSICAL EXAM:  VITAL SIGNS: ED Triage Vitals   Enc Vitals Group     BP 07/27/18 1200 (!) 126/91     Pulse Rate 07/27/18 1200 (!) 40     Resp 07/27/18 1200 16     Temp --      Temp src --      SpO2 07/27/18 1200 100 %     Weight 07/27/18 1130 130 lb (59 kg)     Height 07/27/18 1130 5\' 2"  (1.575 m)     Head Circumference --      Peak Flow --      Pain Score 07/27/18 1130 10     Pain Loc --      Pain Edu? --      Excl. in GC? --     Constitutional: Alert and oriented. Well appearing and in no apparent distress. HEENT:      Head: Normocephalic and atraumatic.         Eyes: Conjunctivae are normal. Sclera is non-icteric.       Mouth/Throat: Mucous membranes are moist.       Neck: Supple with no signs of meningismus. Cardiovascular: Bradycardic with regular rhythm. No murmurs, gallops, or rubs. 2+ symmetrical distal pulses are present in all extremities. No JVD. Respiratory: Normal respiratory effort. Lungs are clear to auscultation bilaterally. No wheezes, crackles, or rhonchi.  Gastrointestinal: Soft, tenderness to palpation on the right upper quadrant epigastric region with positive Murphy sign.  No rebound or guarding. Musculoskeletal: Nontender with normal range of motion in all extremities. No edema, cyanosis, or erythema of extremities. Neurologic: Normal speech and language. Face is symmetric. Moving all extremities. No gross focal neurologic deficits are appreciated. Skin: Skin is  warm, dry and intact. No rash noted. Psychiatric: Mood and affect are normal. Speech and behavior are normal.  ____________________________________________   LABS (all labs ordered are listed, but only abnormal results are displayed)  Labs Reviewed - No data to display ____________________________________________  EKG  ED ECG REPORT I, Nita Sickle, the attending physician, personally viewed and interpreted this ECG.  Sinus bradycardia, rate of 44, normal intervals, normal axis, no ST elevations or depressions.  Otherwise normal EKG.  ____________________________________________  RADIOLOGY  I have personally reviewed the images performed during this visit and I agree with the Radiologist's read.   Interpretation by Radiologist:  US Abdomen Limited Ruq  Result Date: 07/27/2018 CLINICAL DATA:  Elevated LFTs, pain EXAM: ULTRASOUND ABDOMEN LIMITED RIGHT UPPER QUADRANT COMPARISON:  None. FINDINGS: Gallbladder: Gallbladder wall is thickened measuring up to 6 mm. No visible stones. The patient was tender over the gallbladder during the study. Common bile duct: Diameter: Normal caliber, 3 mm Liver: No focal lesion identified. Within normal limits in parenchymal echogenicity. Portal vein is patent on color Doppler imaging with normal direction of blood flow towards the liver. Small amount of perihepatic ascites. IMPRESSION: The gallbladder wall thickening in tenderness over the gallbladder. No visible stones. Cannot exclude acalculous cholecystitis. If further evaluation is felt warranted, nuclear medicine hepatobiliary scan may be helpful. Trace perihepatic ascites. Electronically Signed   By: Charlett Nose M.D.   On: 07/27/2018 10:24     ____________________________________________   PROCEDURES  Procedure(s) performed: None Procedures Critical Care performed:  None ____________________________________________   INITIAL IMPRESSION / ASSESSMENT AND PLAN / ED COURSE  34 y.o. female no significant past medical history who was sent here from [facility] urgent care for acute cholecystitis.  Labs and ultrasound done at urgent care consistent with acute cholecystitis.  Patient was evaluated by Dr. Earlene Plater from surgery who admitted her for a lap chole.  Patient was given pain medication and antibiotics in the emergency room.      As part of my medical decision making, I reviewed the following data within the electronic MEDICAL RECORD NUMBER Nursing notes reviewed and incorporated, Labs reviewed , EKG interpreted , Old chart reviewed,  Radiograph reviewed , A consult was requested and obtained from this/these consultant(s) Surgery, Notes from prior ED visits and  Controlled Substance Database    Pertinent labs & imaging results that were available during my care of the patient were reviewed by me and considered in my medical decision making (see chart for details).    ____________________________________________   FINAL CLINICAL IMPRESSION(S) / ED DIAGNOSES  Final diagnoses:  Cholecystitis      NEW MEDICATIONS STARTED DURING THIS VISIT:  ED Discharge Orders    None       Note:  This document was prepared using Dragon voice recognition software and may include unintentional dictation errors.    Don Perking, [place], MD 07/27/18 1247

## 2018-07-27 NOTE — ED Notes (Signed)
Pt transported to OR by OR tech

## 2018-07-27 NOTE — ED Triage Notes (Signed)
Pt here for gallbladder surgery. Pt was seen at [facility], the provider consulted with the surgeon here and advised her to come here.

## 2018-07-27 NOTE — Transfer of Care (Signed)
Immediate Anesthesia Transfer of Care Note  Patient: Leslie Chang  Procedure(s) Performed: LAPAROSCOPIC CHOLECYSTECTOMY (N/A Abdomen)  Patient Location: PACU  Anesthesia Type:General  Level of Consciousness: awake, alert  and oriented  Airway & Oxygen Therapy: Patient Spontanous Breathing and Patient connected to face mask oxygen  Post-op Assessment: Report given to RN and Post -op Vital signs reviewed and stable  Post vital signs: Reviewed  Last Vitals:  Vitals Value Taken Time  BP    Temp    Pulse 90 07/27/2018  4:48 PM  Resp 23 07/27/2018  4:48 PM  SpO2 89 % 07/27/2018  4:48 PM  Vitals shown include unvalidated device data.  Last Pain:  Vitals:   07/27/18 1352  TempSrc: Temporal  PainSc: 3       Patients Stated Pain Goal: 0 (07/27/18 1352)  Complications: No apparent anesthesia complications

## 2018-07-27 NOTE — Anesthesia Preprocedure Evaluation (Addendum)
Anesthesia Evaluation  Patient identified by MRN, date of birth, ID band Patient awake    Reviewed: Allergy & Precautions, H&P , NPO status , Patient's Chart, lab work & pertinent test results, reviewed documented beta blocker date and time   History of Anesthesia Complications Negative for: history of anesthetic complications  Airway Mallampati: I  TM Distance: >3 FB Neck ROM: full    Dental  (+) Dental Advidsory Given, Chipped   Pulmonary neg pulmonary ROS,           Cardiovascular Exercise Tolerance: Good negative cardio ROS       Neuro/Psych  Headaches, neg Seizures PSYCHIATRIC DISORDERS Depression    GI/Hepatic negative GI ROS, Neg liver ROS,   Endo/Other  negative endocrine ROS  Renal/GU negative Renal ROS  negative genitourinary   Musculoskeletal   Abdominal   Peds  Hematology negative hematology ROS (+)   Anesthesia Other Findings Past Medical History: No date: Depression     Comment:  dt death of untrauterine twins No date: Fluttering heart     Comment:  pt states she feels this occasionally but has never been              worked up No date: Headache     Comment:  has migraines at times 09/2017: Prior pregnancy with fetal demise     Comment:  twins with TTTS. iugr  Patient sinus brady down to the 30s today, which is not normal for her.  Discussed this patient with Dr. Mariah Milling from cardiology who looked at the EKG and the vitals and said that she is safe to proceed with GA at this time given that she is otherwise young and healthy.  He did recommend chronotropic medications to support blood pressure if we need to use them.  Reproductive/Obstetrics negative OB ROS                            Anesthesia Physical Anesthesia Plan  ASA: II  Anesthesia Plan: General   Post-op Pain Management:    Induction: Intravenous  PONV Risk Score and Plan: 3 and Ondansetron,  Dexamethasone, Midazolam, Promethazine and Treatment may vary due to age or medical condition  Airway Management Planned: Oral ETT  Additional Equipment:   Intra-op Plan:   Post-operative Plan: Extubation in OR  Informed Consent: I have reviewed the patients History and Physical, chart, labs and discussed the procedure including the risks, benefits and alternatives for the proposed anesthesia with the patient or authorized representative who has indicated his/her understanding and acceptance.     Dental Advisory Given  Plan Discussed with: Anesthesiologist, CRNA and Surgeon  Anesthesia Plan Comments:        Anesthesia Quick Evaluation

## 2018-07-27 NOTE — ED Provider Notes (Signed)
[address], [address] [place], [place] [address] [phone]   Time seen: Approximately 8:49 AM  Chief Complaint Abdominal Pain   Prior to seeing the patient today, I have reviewed the triage nursing documentation and vital signs.   HPI Leslie Chang is a 33 y.o. female who presents with a 2 day history of mid-abdominal pain with associated nausea. She has not vomited. She describes the pain as "waves of sharp stabbing pain" that she rates 9/10 in clinic today. She experienced a few diarrhea episodes on 07/26/2018.   Patient advises that she "normally" appreciates BRBPR when she is on her menstrual cycles. She states, "even with the bleeding that I normally have, I don't generally have pain". LMP was 07/24/2018. Over the course of the last week, her stools has been dark in color x 1 week. She endorses frequent NSAID use (IBU) until earlier this week when she switched to APAP due to recommendations associated with COVID. She denies fevers and reflux. Patient occasionally consumes ETOH (beer).  PMH (+) for anemia during pregnancy that required her to be on oral iron therapy. Patient suffered fetal demise of her twice "last year" and has not really taken care of herself since. Patient has never been seen in the past by gastroenterology; she has never had a EGD or colonoscopy.   History reviewed. No pertinent past medical history.  There are no active problems to display for this patient.   Past Surgical History:  Procedure Laterality Date   DILATION AND CURETTAGE OF UTERUS       Current Facility-Administered Medications:    alum & mag hydroxide-simeth (MAALOX/MYLANTA) 200-200-20 MG/5ML suspension 30 mL, 30 mL, Oral, Once **AND** lidocaine (XYLOCAINE) 2 % viscous mouth solution 15 mL, 15 mL, Oral, Once, Verlee Monte, NP No current outpatient medications on file.  Allergies Patient has no known allergies.  Family history History reviewed. No pertinent family  history.  Social history Social History   Tobacco Use   Smoking status: Never Smoker   Smokeless tobacco: Never Used  Substance Use Topics   Alcohol use: Yes    Comment: occasionally   Drug use: No    Review of Systems Review of Systems  Constitutional: Negative for chills and fever.  HENT: Negative for ear pain, sore throat and trouble swallowing.   Eyes: Negative.   Respiratory: Negative for cough and shortness of breath.   Cardiovascular: Negative for chest pain and palpitations.  Gastrointestinal: Positive for abdominal pain, blood in stool, diarrhea and nausea. Negative for vomiting.  Genitourinary: Negative for dysuria and hematuria.  Musculoskeletal: Negative for arthralgias and back pain.  Skin: Negative for pallor and rash.  Neurological: Negative for dizziness, seizures, syncope, weakness and light-headedness.  Hematological: Bruises/bleeds easily ("normally" has blood in stool with menstrual cycles).  Psychiatric/Behavioral: Negative.   All other systems reviewed and are negative.    ____________________________________________  PHYSICAL EXAM:  VITAL SIGNS: ED Triage Vitals  Enc Vitals Group     BP 07/27/18 0834 129/66     Pulse Rate 07/27/18 0834 (!) 43     Resp 07/27/18 0834 18     Temp 07/27/18 0834 98.5 F (36.9 C)     Temp Source 07/27/18 0834 Oral     SpO2 07/27/18 0834 100 %     Weight 07/27/18 0831 130 lb (59 kg)     Height 07/27/18 0831 5\' 2"  (1.575 m)     Head Circumference --      Peak Flow --  Pain Score 07/27/18 0831 9     Pain Loc --      Pain Edu? --      Excl. in GC? --     Physical Exam  Constitutional: She appears well-developed. No distress.  HENT:  Head: Normocephalic and atraumatic.  Eyes: Pupils are equal, round, and reactive to light. Conjunctivae are normal.  Neck: Normal range of motion. Neck supple.  Cardiovascular: Regular rhythm and normal heart sounds. Bradycardia present. Exam reveals no gallop and no  friction rub.  No murmur heard. Asymptomatic  Pulmonary/Chest: Effort normal and breath sounds normal. No respiratory distress. She has no wheezes. She has no rhonchi. She has no rales. She exhibits no tenderness.  Abdominal: Soft. Bowel sounds are normal. She exhibits no distension and no mass. There is no splenomegaly or hepatomegaly. There is generalized abdominal tenderness and tenderness in the right upper quadrant. There is positive Murphy's sign. No hernia.  Neurological: She is alert. She displays no weakness.  Skin: Skin is warm and dry. No rash noted. No pallor.  Psychiatric: Her behavior is normal. Mood normal.    __________________________________________   LABS PLEASE NOTE: all labs ordered are listed, but only abnormal results are displayed.  Labs Reviewed  CBC WITH DIFFERENTIAL/PLATELET - Abnormal; Notable for the following components:      Result Value   RBC 3.82 (*)    Hemoglobin 11.6 (*)    HCT 34.3 (*)    All other components within normal limits  COMPREHENSIVE METABOLIC PANEL - Abnormal; Notable for the following components:   Glucose, Bld 105 (*)    Calcium 8.6 (*)    AST 159 (*)    ALT 436 (*)    Total Bilirubin 1.4 (*)    All other components within normal limits  LIPASE, BLOOD   ____________________________________________ RADIOLOGY  US Abdomen Limited Ruq  Result Date: 07/27/2018 CLINICAL DATA:  Elevated LFTs, pain EXAM: ULTRASOUND ABDOMEN LIMITED RIGHT UPPER QUADRANT COMPARISON:  None. FINDINGS: Gallbladder: Gallbladder wall is thickened measuring up to 6 mm. No visible stones. The patient was tender over the gallbladder during the study. Common bile duct: Diameter: Normal caliber, 3 mm Liver: No focal lesion identified. Within normal limits in parenchymal echogenicity. Portal vein is patent on color Doppler imaging with normal direction of blood flow towards the liver. Small amount of perihepatic ascites. IMPRESSION: The gallbladder wall thickening in  tenderness over the gallbladder. No visible stones. Cannot exclude acalculous cholecystitis. If further evaluation is felt warranted, nuclear medicine hepatobiliary scan may be helpful. Trace perihepatic ascites. Electronically Signed   By: Charlett Nose M.D.   On: 07/27/2018 10:24   ____________________________________________  INITIAL IMPRESSION / ASSESSMENT AND PLAN / URGENT CARE COURSE  Pertinent labs & imaging results that were available during my care of the patient were personally reviewed by me and considered in my medical decision making (see chart for details).   Leslie Chang is a 33 y.o. female who presents with a 2 day history of diffuse abdominal pain, with the pain being worse in her RUQ. (+) nausea without associated vomiting. Patient also complains of dark stools x 1 week. She notes passing BRBPR "normally" when she is on her menstrual cycles. LMP was on 06/27/2018.  Patient was given a GI cocktail with lidocaine in the clinic, as interventions are limited in the [facility] setting. Intervention was ineffective in reducing patient's pain.  Patient with significant tenderness in her RUQ on exam. She exhibits a (+) Murphy's sign. Initial impression  that this was going to be GI related secondary to increased NSAID use, however after review of her labs, there was an acute tranaminitis noted. US imaging of her RUQ revealed mild gallbladder wall thickening with associated trace perihepatic ascites. There were no gallstones appreciated on imaging. Exam, labs, and imaging suggestive of acute acalculous cholecystitis.   Communicated with Dr. Satira Mccallum via secure messaging. MD review imaging and labs. Dr. Earlene Plater asking that patient be sent to the [facility] ED, where he will see her and discuss consideration of a laparoscopic cholecystectomy later today. Patient notes that her last meal was consumed last night. Plan of care was discussed with the patient, and she is in agreement with the plan as it has been  reviewed with her.   Report called to ED charge nurse Lajuana Ripple, RN) to advise that patient would be presenting via POV for evaluation by surgery.    ____________________________________________  FINAL CLINICAL IMPRESSION(S) / URGENT CARE DIAGNOSES  Final diagnoses:  Elevated LFTs  Right upper quadrant abdominal pain  Cholecystitis, acute    ED Discharge Orders         Ordered    Ambulatory referral to General Surgery    Comments:  Abdominal pain; [place] suggests acalculous cholecystitis. LFTS elevated.   07/27/18 1044          Clinical Course as of Jul 27 1106  Thu Jul 27, 2018  2536 Followed up after GI cocktail - no relief. Labs reviewed. LFTs HIGH. Reviewed results with patient - decision made to obtain US imaging.    [BG]  1045 Communicated with Dr. Satira Mccallum regarding [place] results and elevated LFTs. Surgeon to meet patient in ED at [facility] for evaluation. Plans are to take patient to OR for lap chole today.    [BG]    Clinical Course User Index [BG] Verlee Monte, NP    Note: This note was prepared using Dragon dictation along with smaller phrase technology. Despite my best ability to proofread, transcriptional errors may occur from this process, and are completely unintentional.   Quentin Mulling, MSN, APRN, FNP-C, CEN Advanced Practice Provider [facility]   Verlee Monte, NP 07/27/18 1120

## 2018-07-27 NOTE — OR Nursing (Signed)
Dr. Karlton Lemon notified of bradycardia identified on EKG from ER. She occasionally states her heart "flutters" and SOB at times. He requested a cardiology consult. Left message for Dr. Mariah Milling to call us in SDS.  Dr. Karlton Lemon reviewed EKG with Dr. Mariah Milling on the phone and it was determined that she was okay to go to surgery at this time.

## 2018-07-27 NOTE — Anesthesia Postprocedure Evaluation (Signed)
Anesthesia Post Note  Patient: Leslie Chang  Procedure(s) Performed: LAPAROSCOPIC CHOLECYSTECTOMY (N/A Abdomen)  Patient location during evaluation: PACU Anesthesia Type: General Level of consciousness: awake and alert Pain management: pain level controlled Vital Signs Assessment: post-procedure vital signs reviewed and stable Respiratory status: spontaneous breathing, nonlabored ventilation, respiratory function stable and patient connected to nasal cannula oxygen Cardiovascular status: blood pressure returned to baseline and stable Postop Assessment: no apparent nausea or vomiting Anesthetic complications: no     Last Vitals:  Vitals:   07/27/18 1729 07/27/18 1745  BP: 133/66 (!) 149/87  Pulse: (!) 55 72  Resp: 14 20  Temp: 37.2 C 36.9 C  SpO2: 96% 92%    Last Pain:  Vitals:   07/27/18 1745  TempSrc: Axillary  PainSc: 0-No pain                 Cleda Mccreedy Montine Hight

## 2018-07-27 NOTE — ED Triage Notes (Signed)
Patient complains of abdominal pain that is above belly button to sternum. Patient states that this started around 2 days ago. States that she started her menstrual cycle on Monday. States that she typically has bloody stools with cycle and has noticed those again.

## 2018-07-27 NOTE — Discharge Instructions (Signed)
In addition to included general post-operative instructions for Laparoscopic Cholecystectomy,  Diet: Resume home heart healthy diet (may prefer to start with low fat diet to minimize loose bowel movements while body is adapting to cholecystectomy).   Activity: No heavy lifting >20 pounds (children, pets, laundry, garbage) or strenuous activity until follow-up in 2 weeks, but light activity and walking are encouraged. Do not drive or drink alcohol if taking narcotic pain medications.  Wound care: 2 days after surgery (Saturday, 4/4), you may shower/get incision wet with soapy water and pat dry (do not rub incisions), but no baths or submerging incision underwater until follow-up.   Medications: Resume all home medications. For mild to moderate pain: acetaminophen (Tylenol) or ibuprofen/naproxen (if no kidney disease). Combining Tylenol with alcohol can substantially increase your risk of causing liver disease. Narcotic pain medications, if prescribed, can be used for severe pain, though may cause nausea, constipation, and drowsiness. Do not combine Tylenol and Percocet (or similar) within a 6 hour period as Percocet (and similar) contain(s) Tylenol. If you do not need the narcotic pain medication, you do not need to fill the prescription.  Call office [phone]) at any time if any questions, worsening pain, fevers/chills, bleeding, drainage from incision site, or other concerns.

## 2018-07-27 NOTE — ED Notes (Signed)
Dr. Davis at bedside at this time.  

## 2018-07-27 NOTE — Op Note (Signed)
SURGICAL OPERATIVE REPORT   DATE OF PROCEDURE: 07/27/2018  ATTENDING Surgeon(s): Ancil Linsey, MD  ANESTHESIA: GETA  PRE-OPERATIVE DIAGNOSIS: Acute Cholecystitis (K80.00)  POST-OPERATIVE DIAGNOSIS: Acute Cholecystitis (K80.00)  PROCEDURE(S): (cpt's: 47562) 1.) Laparoscopic Cholecystectomy  INTRAOPERATIVE FINDINGS: Severe gallbladder wall edema with moderately severe pericholecystic inflammation and 125 mL murky clear pericholecystic/hepatic ascites, cystic duct and cystic artery clips well-secured, hemostasis at completion of procedure  INTRAOPERATIVE FLUIDS: 500 mL crystalloid   ESTIMATED BLOOD LOSS: Minimal (<20 mL)   URINE OUTPUT: No foley  SPECIMENS: Gallbladder  IMPLANTS: None  DRAINS: None   COMPLICATIONS: None apparent   CONDITION AT COMPLETION: Hemodynamically stable and extubated  DISPOSITION: PACU   INDICATION(S) FOR PROCEDURE:  Patient is a 33 y.o. female who this admission presented with RUQ and epigastric abdominal pain x 2 days. Ultrasound suggested acute cholecystitis without visualization of cholelithiasis. All risks, benefits, and alternatives to above elective procedures were discussed with the patient, who elected to proceed, and informed consent was accordingly obtained at that time.   DETAILS OF PROCEDURE:  Patient was brought to the operating suite and appropriately identified. General anesthesia was administered along with peri-operative prophylactic IV antibiotics, and endotracheal intubation was performed by anesthesiologist. In supine position, operative site was prepped and draped in usual sterile fashion, and following a brief time out, initial 5 mm incision was made in a natural skin crease just above the umbilicus. Fascia was then elevated, and a Verress needle was inserted and its proper position confirmed using aspiration and saline meniscus test.  Upon insufflation of the abdominal cavity with carbon dioxide to a well-tolerated pressure  of 12-15 mmHg, 5 mm peri-umbilical port followed by laparoscope were inserted and used to inspect the abdominal cavity and its contents with no injuries from insertion of the first trochar noted. Three additional trocars were inserted, one at the epigastric position (10 mm) and two along the Right costal margin (5 mm). 125 mL of clear-colored murky ascites was suctioned from over the liver. The table was then placed in reverse Trendelenburg position with the Right side up. The gallbladder was clearly distended, but did not require decompression to permit grasping it. Filmy adhesions between the gallbladder and omentum/duodenum/transverse colon were lysed using combined blunt dissection and selective electrocautery. The apex/dome of the gallbladder was grasped with an atraumatic grasper passed through the lateral port and retracted apically over the liver. The infundibulum was also grasped and retracted, exposing Calot's triangle. The peritoneum overlying the gallbladder infundibulum was incised and dissected free of surrounding peritoneal attachments, revealing the cystic duct and cystic artery, which were clipped twice on the patient side and once on the gallbladder specimen side close to the gallbladder. The gallbladder was then dissected from its peritoneal attachments to the liver using electrocautery, and the gallbladder was placed into a laparoscopic specimen bag and removed from the abdominal cavity via the epigastric port site. Hemostasis and secure placement of clips were confirmed, and intra-peritoneal cavity was inspected with no additional findings. No stones were appreciated upon palpation of the gallbladder. PMI laparoscopic fascial closure device was then used to re-approximate fascia at the 10 mm epigastric port site.  All ports were then removed under direct visualization, and abdominal cavity was desuflated. All port sites were irrigated/cleaned, additional local anesthetic was injected at each  incision, 3-0 Vicryl was used to re-approximate dermis at 10 mm port site(s), and subcuticular 4-0 Monocryl suture was used to re-approximate skin. Skin was then cleaned, dried, and sterile skin glue was  applied. Patient was then safely able to be awakened, extubated, and transferred to PACU for post-operative monitoring and care.   I was present for all aspects of the above procedure, and no operative complications were apparent.

## 2018-07-27 NOTE — ED Notes (Addendum)
Pt reports that between sternum and umbilicus she is having constant pain and with movement it is sharp x2 days - no radiation of pain - pt is currently on period  - report nausea but not vomiting - started with diarrhea last pm and has had 2 episodes - no fever - pt reports blood in stool that is normal for her when she is having her period

## 2018-07-28 ENCOUNTER — Encounter: Payer: Self-pay | Admitting: Surgery

## 2018-07-28 LAB — COMPREHENSIVE METABOLIC PANEL
ALT: 275 U/L — ABNORMAL HIGH (ref 0–44)
AST: 76 U/L — ABNORMAL HIGH (ref 15–41)
Albumin: 3.5 g/dL (ref 3.5–5.0)
Alkaline Phosphatase: 104 U/L (ref 38–126)
Anion gap: 8 (ref 5–15)
BUN: 7 mg/dL (ref 6–20)
CO2: 23 mmol/L (ref 22–32)
Calcium: 8.2 mg/dL — ABNORMAL LOW (ref 8.9–10.3)
Chloride: 105 mmol/L (ref 98–111)
Creatinine, Ser: 0.81 mg/dL (ref 0.44–1.00)
GFR calc Af Amer: >60 mL/min (ref >60)
GFR calc non Af Amer: >60 mL/min (ref >60)
Glucose, Bld: 122 mg/dL — ABNORMAL HIGH (ref 70–99)
Potassium: 3.8 mmol/L (ref 3.5–5.1)
Sodium: 136 mmol/L (ref 135–145)
Total Bilirubin: 1 mg/dL (ref 0.3–1.2)
Total Protein: 6.6 g/dL (ref 6.5–8.1)

## 2018-07-28 MED ORDER — IBUPROFEN 800 MG PO TABS: 800.0000 mg | ORAL_TABLET | Freq: Three times a day (TID) | ORAL | 0 refills | Status: AC | PRN

## 2018-07-28 MED ORDER — OXYCODONE HCL 5 MG PO TABS: 5.0000 mg | ORAL_TABLET | ORAL | 0 refills | Status: AC | PRN

## 2018-07-28 NOTE — Discharge Summary (Signed)
[facility] SURGICAL DISCHARGE SUMMARY   Patient ID: Leslie Chang MRN: [id number] DOB/AGE: 30-Jul-1985 34 y.o.  Admit date: 07/27/2018 Discharge date: 07/28/2018  Discharge Diagnoses Patient Active Problem List   Diagnosis Date Noted  . Acute cholecystitis 07/27/2018    Consultants None  Procedures 07/27/2018  HPI: 33 [facility]. Patient reportsshe first developed epigastric > RUQ abdominal pain 2 days ago (specifically, Tuesday evening 3/31) after eating chicken noodle soup made from scratch. Patient says she has not eaten chicken/meat x 15 years until the past month after she experienced loss of fetal twins and decided to disregard her prior diet. Instead of improving, her pain has instead worsened, becoming severe over the RUQ. She otherwise is not aware of prior RUQ abdominal pain and denies routine/frequent GERD. She denies any fever/chills, cough, CP, or SOB, N/V, or diarrhea. Of note, patient also mentions blood with BM's exclusively during menstruation that ceased during her recent pregnancy and resumed following her miscarriage.She was scheduled to undergo colonoscopy prior to her recent pregnancy, but that was cancelled when she became pregnant and has not since been rescheduled. Work up in the [employer] [facility] was concerning for acute cholecystitis.   Hospital Course: Informed consent was obtained and documented, and patient underwent uneventful laparoscopic cholecystectomy (Dr Earlene Plater, 07/27/2018).  Post-operatively, patient's pain improved/resolved and advancement of patient's diet and ambulation were well-tolerated. The remainder of patient's hospital course was essentially unremarkable, and discharge planning was initiated accordingly with patient safely able to be discharged home with appropriate discharge instructions, pain control, and outpatient follow-up after all of her questions were answered to her  expressed satisfaction.   Discharge Condition: Good   Physical Examination:  Constitutional: Well appearing female, NAD HEENT: No scleral icterus Pulmonary: Normal effort, no respiratory distress Gastrointestinal: Soft, non-tender, non-distended. No rebound/guarding Skin: Laparoscopic incisions are CDI, no erythema or drainage   Allergies as of 07/28/2018      Reactions   Latex Other (See Comments)   Burning, redness, edema. Noted specifically with condom use      Medication List    TAKE these medications   ibuprofen 800 MG tablet Commonly known as:  ADVIL,MOTRIN Take 1 tablet (800 mg total) by mouth every 8 (eight) hours as needed.   oxyCODONE 5 MG immediate release tablet Commonly known as:  Oxy IR/ROXICODONE Take 1 tablet (5 mg total) by mouth every 4 (four) hours as needed for severe pain.        Follow-up Information    Ancil Linsey, MD. Schedule an appointment as soon as possible for a visit in 2 week(s).   Specialty:  General Surgery Contact information: [address] [place] [address] [phone]            Time spent on discharge management including discussion of hospital course, clinical condition, outpatient instructions, prescriptions, and follow up with the patient and members of the medical team: >30 minutes  -- Lynden Oxford , PA-C [facility]  07/28/2018, 9:56 AM [phone] M-F: 7am - 4pm

## 2018-07-28 NOTE — Progress Notes (Signed)
Con Memos to be D/C'd Home per MD order.  Discussed prescriptions and follow up appointments with the patient. Prescriptions given to patient, medication list explained in detail. Pt verbalized understanding.  Allergies as of 07/28/2018      Reactions   Latex Other (See Comments)   Burning, redness, edema. Noted specifically with condom use      Medication List    TAKE these medications   ibuprofen 800 MG tablet Commonly known as:  ADVIL,MOTRIN Take 1 tablet (800 mg total) by mouth every 8 (eight) hours as needed.   oxyCODONE 5 MG immediate release tablet Commonly known as:  Oxy IR/ROXICODONE Take 1 tablet (5 mg total) by mouth every 4 (four) hours as needed for severe pain.       Vitals:   07/28/18 0458 07/28/18 1157  BP: (!) 123/57 133/86  Pulse: 69 (!) 49  Resp: 18   Temp: 99.6 F (37.6 C) 98.5 F (36.9 C)  SpO2: 97% 100%    Skin clean, dry and intact without evidence of skin break down, no evidence of skin tears noted. IV catheter discontinued intact. Site without signs and symptoms of complications. Dressing and pressure applied. Pt denies pain at this time. No complaints noted.  An After Visit Summary was printed and given to the patient. Patient escorted via WC, and D/C home via private auto.  Madie Reno, RN

## 2018-07-31 LAB — SURGICAL PATHOLOGY

## 2018-08-10 ENCOUNTER — Other Ambulatory Visit: Payer: Self-pay

## 2018-08-10 ENCOUNTER — Telehealth (INDEPENDENT_AMBULATORY_CARE_PROVIDER_SITE_OTHER): Payer: Self-pay | Admitting: Surgery

## 2018-08-10 DIAGNOSIS — K810 Acute cholecystitis: Secondary | ICD-10-CM

## 2018-08-10 NOTE — Progress Notes (Signed)
Virtual Visit via Telemedicine Note  I connected with Leslie Chang by Facetime at her home on 08/10/18 at 10:00 AM EDT and verified that I was speaking with the correct person using their name and date of birth.   I discussed the limitations, risks, security and privacy concerns of performing an evaluation and management service by video telemedicine and the availability of in person appointments. I also discussed with the patient that there may be a patient responsible charge related to this service. The patient expressed understanding and agreed to proceed.  History of Present Illness: 33 y.o. Female is being evaluated for post-op follow-up 14 Days/weeks s/p laparoscopic cholecystectomy Earlene Plater, 07/27/2018) for acute cholecystitis. Patient reports complete resolution of pre-operative pain and has been tolerating regular diet with +flatus and normal BM's, denies N/V, fever/chills, CP, or SOB. Patient further describes incision(s) as well-approximated without any surrounding redness or any associated drainage, purulent or otherwise, and her wounds appear consistent with this description upon review via Facetime video.  Review of Systems:  Constitutional: denies fever/chills  Respiratory: denies shortness of breath, wheezing  Cardiovascular: denies chest pain, palpitations  Gastrointestinal: abdominal pain, N/V, and bowel function as per interval history Skin: Denies any other rashes or skin discolorations except post-surgical wounds as per interval history  Imaging: No new pertinent imaging available for review   Assessment:  33 y.o. yo Female with a problem list including...  Patient Active Problem List   Diagnosis Date Noted  . Acute cholecystitis 07/27/2018    doing well at time of current post-surgical encounter/evaluation 14 Days/weeks s/p laparoscopic cholecystectomy Earlene Plater, 07/27/2018) for acute cholecystitis.  Follow-up Instructions / Plan:               - advance diet as tolerated            - okay to submerge incisions under water (baths, swimming) prn             - gradually resume all activities without restrictions over next 2 weeks             - apply sunblock particularly to incisions with sun exposure to reduce pigmentation of scars             - patient advised she may be contacted to schedule a subsequent appointment/exam when such routine follow-up appointments may safely be performed, though patient may certainly decline a subsequent appointment if wishes to do so             - otherwise, return to clinic as needed, instructed to call office if any questions or concerns  All of the above recommendations were discussed with the patient, and all of patient's questions were answered to her expressed satisfaction.  The patient was advised to call back or seek an in-person evaluation if the symptoms worsen or if the condition fails to improve as anticipated.  From [facility] outpatient surgery office, I provided 35 minutes of non-face-to-face time during this encounter.  -- Scherrie Gerlach Earlene Plater, MD, RPVI [place]: [facility] General Surgery - Partnering for exceptional care. Office: [phone]

## 2020-01-07 IMAGING — US ULTRASOUND ABDOMEN LIMITED
1 series · 14 of 25 positions shown · non-contrast
Comparison: None.

CLINICAL DATA: Elevated LFTs, pain

EXAM:
ULTRASOUND ABDOMEN LIMITED RIGHT UPPER QUADRANT

[Series 1: ultrasound abdomen limited · 0.19mm/px · 14 of 41 slices shown]
[im 1/41]
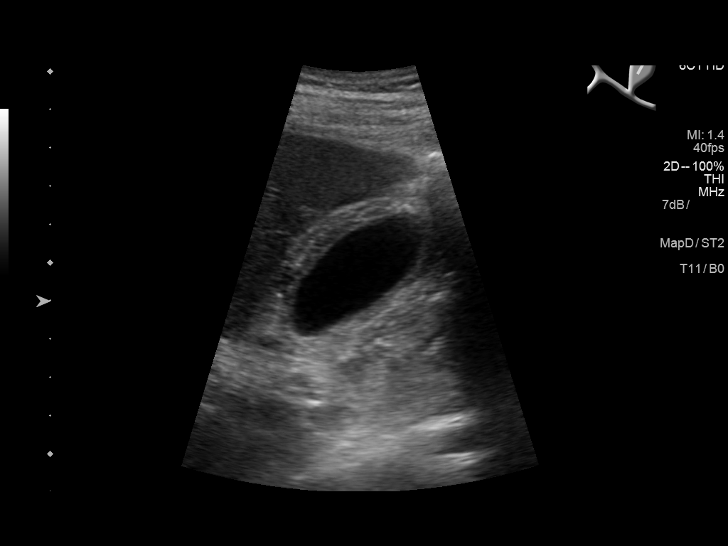
[im 4/41]
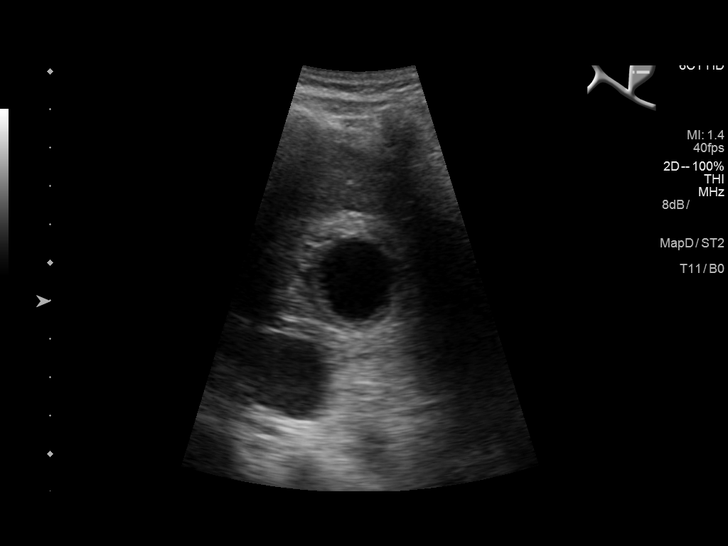
[im 7/41]
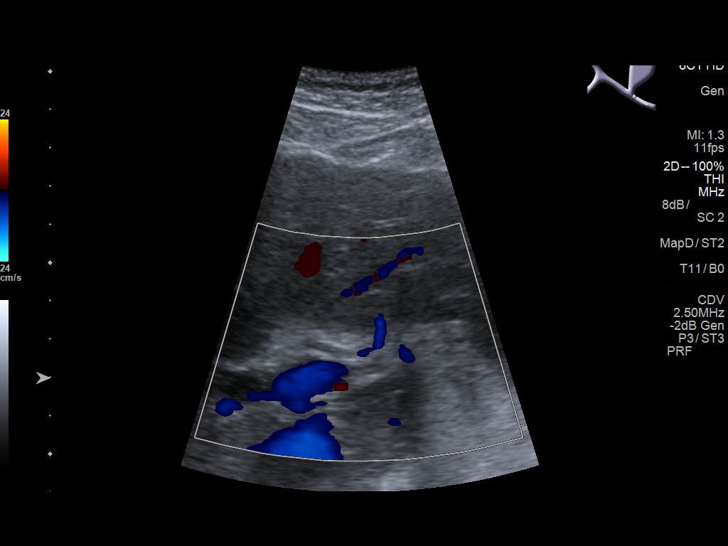
[im 11/41]
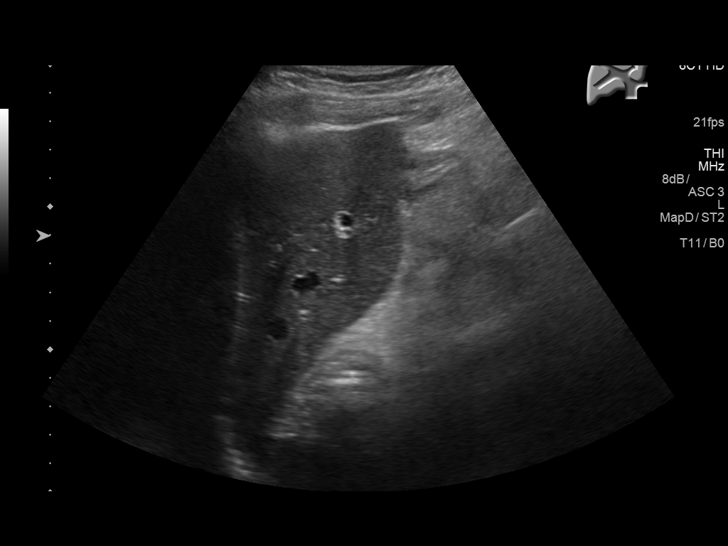
[im 14/41]
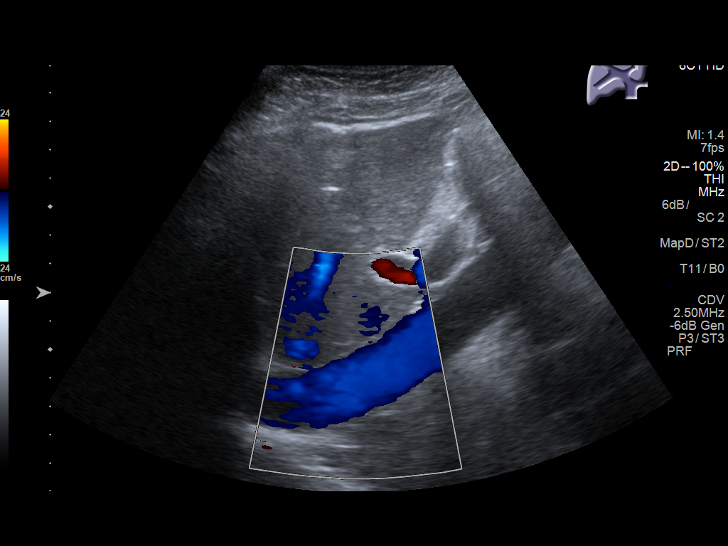
[im 16/41]
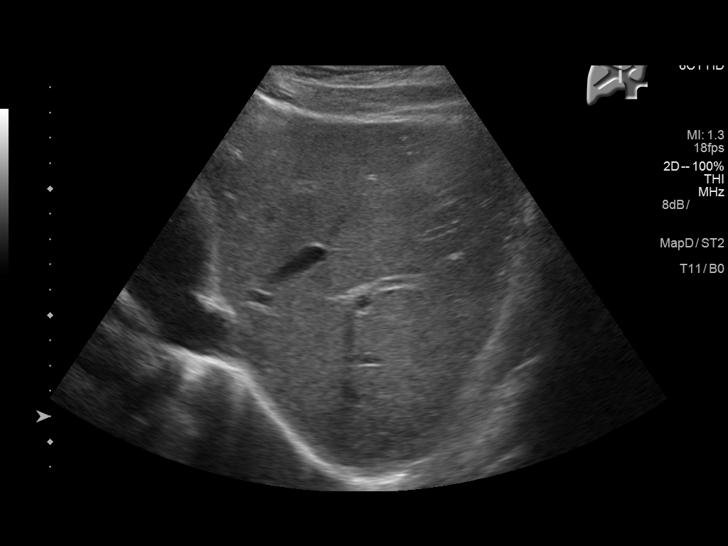
[im 19/41]
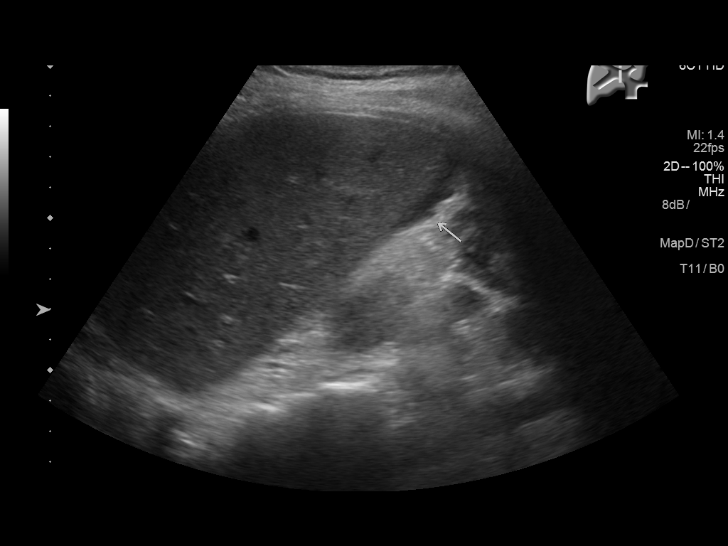
[im 22/41]
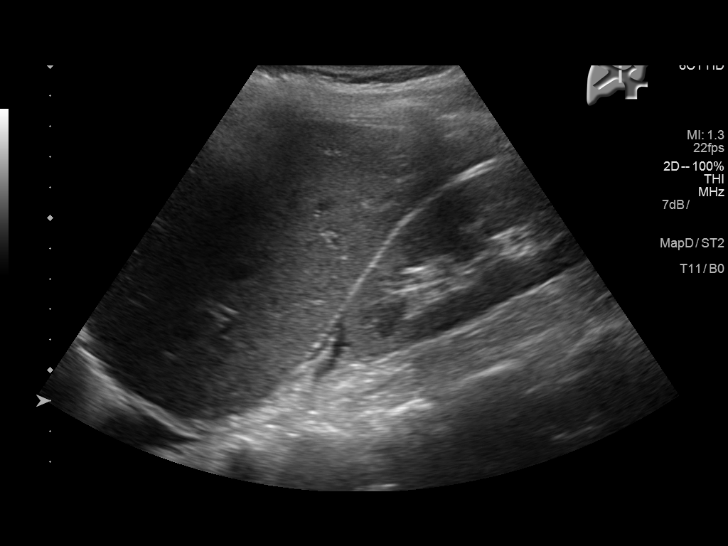
[im 26/41]
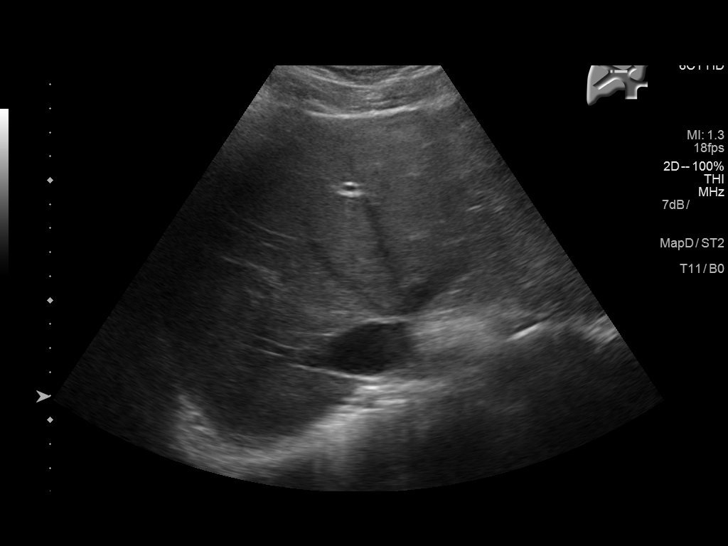
[im 27/41]
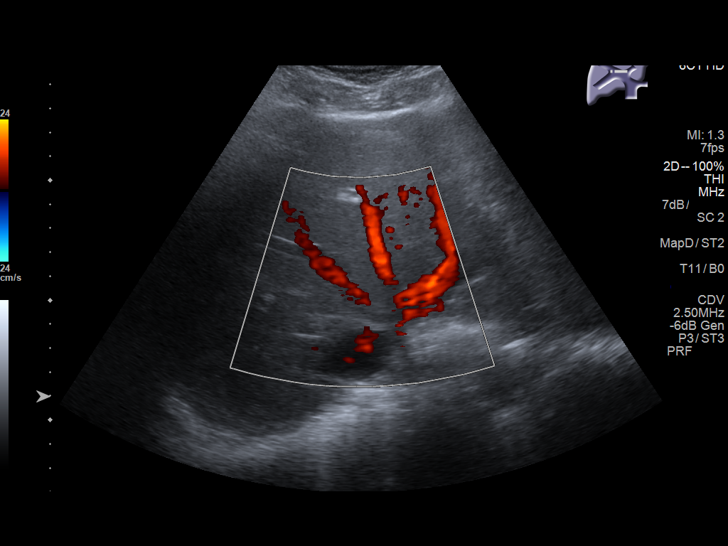
[im 31/41]
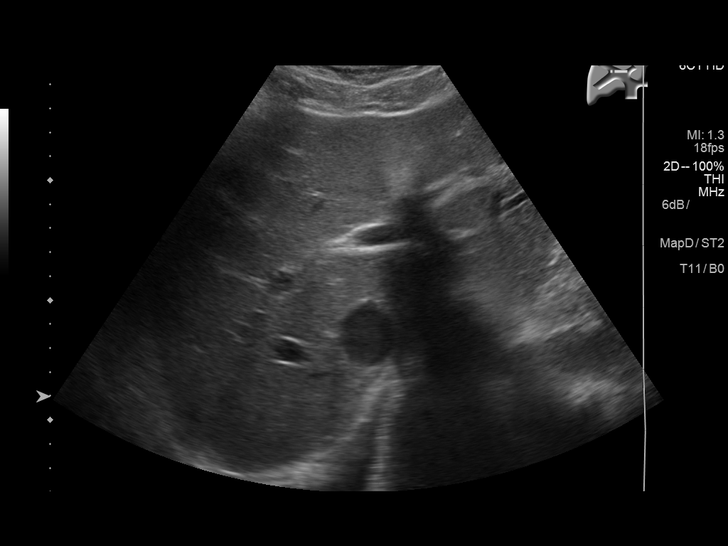
[im 34/41]
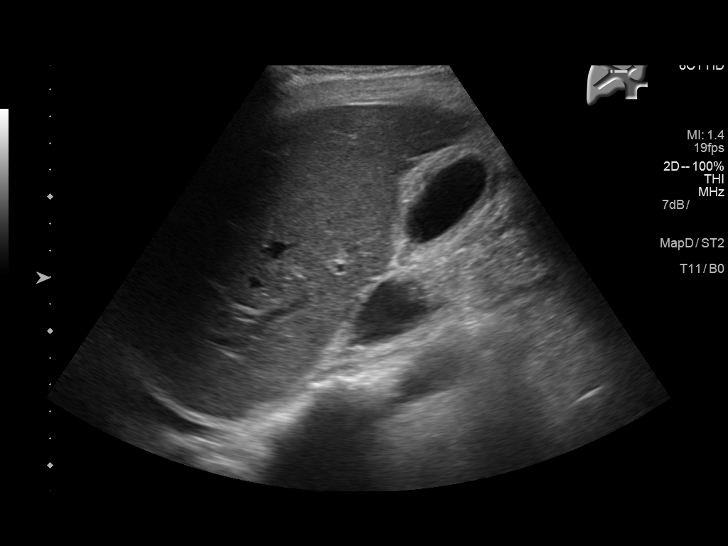
[im 37/41]
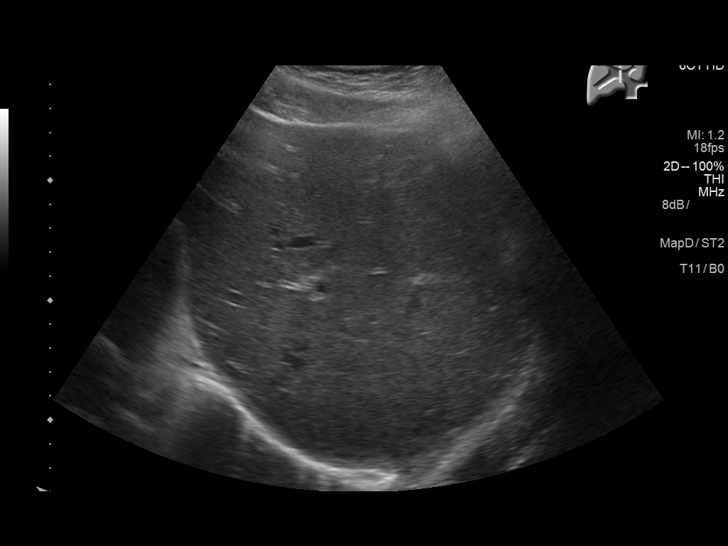
[im 41/41]
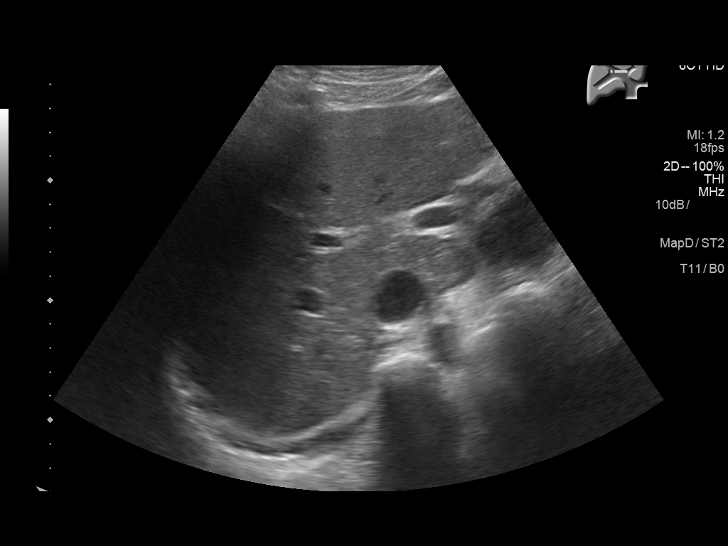

[14 of 25 positions shown; findings below may reference images not displayed]

FINDINGS: Gallbladder:

Gallbladder wall is thickened measuring up to 6 mm. No visible
stones. The patient was tender over the gallbladder during the
study.

Common bile duct:

Diameter: Normal caliber, 3 mm

Liver:

No focal lesion identified. Within normal limits in parenchymal
echogenicity. Portal vein is patent on color Doppler imaging with
normal direction of blood flow towards the liver.

Small amount of perihepatic ascites.
IMPRESSION: The gallbladder wall thickening in tenderness over the gallbladder.
No visible stones. Cannot exclude acalculous cholecystitis. If
further evaluation is felt warranted, nuclear medicine hepatobiliary
scan may be helpful.

Trace perihepatic ascites.

## 2022-03-22 ENCOUNTER — Ambulatory Visit
Admission: EM | Admit: 2022-03-22 | Discharge: 2022-03-22 | Disposition: A | Discharge status: Home / Self Care | Payer: Self-pay | Attending: Urgent Care | Admitting: Urgent Care

## 2022-03-22 DIAGNOSIS — B9689 Other specified bacterial agents as the cause of diseases classified elsewhere: Secondary | ICD-10-CM

## 2022-03-22 DIAGNOSIS — J019 Acute sinusitis, unspecified: Secondary | ICD-10-CM

## 2022-03-22 MED ORDER — AZITHROMYCIN 250 MG PO TABS: ORAL_TABLET | ORAL | 0 refills | Status: AC

## 2022-03-22 NOTE — ED Provider Notes (Signed)
[facility]    CSN: [id number] Arrival date & time: 03/22/22  0857      History   Chief Complaint No chief complaint on file.   HPI Leslie Chang is a 36 y.o. female.   HPI  Presents to urgent care with suspicion of sinus infection.  She endorses production of "brown chunks of mucus".  Denies any over-the-counter treatment as yet.  She states symptoms x 9 days.  She endorses history of COVID infection about 6 weeks ago with ongoing cough.  Was in contact with her child who has respiratory symptoms as well.  Endorses left-sided face pain around her eyes and jaw.  Past Medical History:  Diagnosis Date   Depression    dt death of untrauterine twins   Fluttering heart    pt states she feels this occasionally but has never been worked up   Headache    has migraines at times   PONV (postoperative nausea and vomiting)    Prior pregnancy with fetal demise 09/2017   twins with TTTS. iugr    Patient Active Problem List   Diagnosis Date Noted   Acute cholecystitis 07/27/2018    Past Surgical History:  Procedure Laterality Date   CHOLECYSTECTOMY N/A 07/27/2018   Procedure: LAPAROSCOPIC CHOLECYSTECTOMY;  Surgeon: Ancil Linsey, MD;  Location: [facility] ORS;  Service: General;  Laterality: N/A;   DILATION AND CURETTAGE OF UTERUS  09/2017   after nsvd of nonviable twins and for retained placenta    OB History   No obstetric history on file.      Home Medications    Prior to Admission medications   Medication Sig Start Date End Date Taking? Authorizing Provider  ibuprofen (ADVIL,MOTRIN) 800 MG tablet Take 1 tablet (800 mg total) by mouth every 8 (eight) hours as needed. 07/28/18   Donovan Kail, PA-C  oxyCODONE (OXY IR/ROXICODONE) 5 MG immediate release tablet Take 1 tablet (5 mg total) by mouth every 4 (four) hours as needed for severe pain. 07/28/18   Donovan Kail, PA-C    Family History History reviewed. No pertinent family history.  Social  History Social History   Tobacco Use   Smoking status: Never   Smokeless tobacco: Never  Vaping Use   Vaping Use: Never used  Substance Use Topics   Alcohol use: Yes    Comment: occasionally   Drug use: No     Allergies   Latex   Review of Systems Review of Systems   Physical Exam Triage Vital Signs ED Triage Vitals  Enc Vitals Group     BP 03/22/22 1004 108/73     Pulse Rate 03/22/22 1004 80     Resp 03/22/22 1004 20     Temp 03/22/22 1004 98.3 F (36.8 C)     Temp Source 03/22/22 1004 Oral     SpO2 03/22/22 1004 97 %     Weight --      Height --      Head Circumference --      Peak Flow --      Pain Score 03/22/22 1005 4     Pain Loc --      Pain Edu? --      Excl. in GC? --    No data found.  Updated Vital Signs BP 108/73 (BP Location: Left Arm)   Pulse 80   Temp 98.3 F (36.8 C) (Oral)   Resp 20   LMP 02/28/2022   SpO2 97%  Visual Acuity Right Eye Distance:   Left Eye Distance:   Bilateral Distance:    Right Eye Near:   Left Eye Near:    Bilateral Near:     Physical Exam Vitals reviewed.  Constitutional:      Appearance: Normal appearance. She is not ill-appearing.  HENT:     Head: Normocephalic and atraumatic.     Right Ear: Tympanic membrane normal.     Left Ear: Tympanic membrane normal.     Nose: Congestion present.     Left Sinus: Maxillary sinus tenderness present.  Eyes:     Conjunctiva/sclera: Conjunctivae normal.     Pupils: Pupils are equal, round, and reactive to light.  Cardiovascular:     Rate and Rhythm: Normal rate and regular rhythm.     Pulses: Normal pulses.     Heart sounds: Normal heart sounds.  Pulmonary:     Effort: Pulmonary effort is normal.     Breath sounds: Normal breath sounds.  Skin:    General: Skin is warm and dry.  Neurological:     General: No focal deficit present.     Mental Status: She is alert and oriented to person, place, and time.  Psychiatric:        Mood and Affect: Mood normal.         Behavior: Behavior normal.      UC Treatments / Results  Labs (all labs ordered are listed, but only abnormal results are displayed) Labs Reviewed - No data to display  EKG   Radiology No results found.  Procedures Procedures (including critical care time)  Medications Ordered in UC Medications - No data to display  Initial Impression / Assessment and Plan / UC Course  I have reviewed the triage vital signs and the nursing notes.  Pertinent labs & imaging results that were available during my care of the patient were reviewed by me and considered in my medical decision making (see chart for details).   Patient is afebrile here without recent antipyretics. Satting well on room air. Overall is well appearing, well hydrated, without respiratory distress. Pulmonary exam is unremarkable.  Lungs CTAB with no wheezing.  TMs WNL bilaterally.  Left sinus maxillary tenderness with palpation.  Presumed acute bacterial sinusitis secondary to past viral URI.  She endorses allergy to penicillin though no documentation in her chart and 2019 encounter with prescription for Augmentin.  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  Will treat with azithromycin.    Final Clinical Impressions(s) / UC Diagnoses   Final diagnoses:  None   Discharge Instructions   None    ED Prescriptions   None    PDMP not reviewed this encounter.   Charma Igo, FNP 03/22/22 1100

## 2022-03-22 NOTE — Discharge Instructions (Addendum)
Follow up here or with your primary care provider if your symptoms are worsening or not improving with treatment.     

## 2022-03-22 NOTE — ED Triage Notes (Signed)
Pt reports she thinks she has a sinus infection. She has some brown chunks of mucus. Didn't take anything for symptoms
